# Patient Record
Sex: Female | Born: 1975 | Race: White | Hispanic: No | Marital: Married | Smoking: Former smoker
Health system: Southern US, Community
[De-identification: ages and names within clinical notes are randomized; demographics above are authoritative.]

## PROBLEM LIST (undated history)

## (undated) DIAGNOSIS — F32A Depression, unspecified: Secondary | ICD-10-CM

## (undated) DIAGNOSIS — N920 Excessive and frequent menstruation with regular cycle: Secondary | ICD-10-CM

## (undated) DIAGNOSIS — F419 Anxiety disorder, unspecified: Secondary | ICD-10-CM

## (undated) DIAGNOSIS — T7840XA Allergy, unspecified, initial encounter: Secondary | ICD-10-CM

## (undated) DIAGNOSIS — N946 Dysmenorrhea, unspecified: Secondary | ICD-10-CM

## (undated) DIAGNOSIS — C4359 Malignant melanoma of other part of trunk: Secondary | ICD-10-CM

## (undated) DIAGNOSIS — F909 Attention-deficit hyperactivity disorder, unspecified type: Secondary | ICD-10-CM

## (undated) DIAGNOSIS — F329 Major depressive disorder, single episode, unspecified: Secondary | ICD-10-CM

## (undated) HISTORY — PX: SKIN CANCER EXCISION: SHX779

## (undated) HISTORY — DX: Dysmenorrhea, unspecified: N94.6

## (undated) HISTORY — DX: Anxiety disorder, unspecified: F41.9

## (undated) HISTORY — DX: Malignant melanoma of other part of trunk: C43.59

## (undated) HISTORY — DX: Major depressive disorder, single episode, unspecified: F32.9

## (undated) HISTORY — PX: WRIST SURGERY: SHX841

## (undated) HISTORY — PX: BREAST ENHANCEMENT SURGERY: SHX7

## (undated) HISTORY — DX: Attention-deficit hyperactivity disorder, unspecified type: F90.9

## (undated) HISTORY — DX: Depression, unspecified: F32.A

## (undated) HISTORY — DX: Excessive and frequent menstruation with regular cycle: N92.0

## (undated) HISTORY — DX: Allergy, unspecified, initial encounter: T78.40XA

---

## 2007-02-26 DIAGNOSIS — G4700 Insomnia, unspecified: Secondary | ICD-10-CM | POA: Insufficient documentation

## 2011-01-20 ENCOUNTER — Encounter: Payer: Self-pay | Admitting: Family Medicine

## 2011-01-20 ENCOUNTER — Ambulatory Visit (INDEPENDENT_AMBULATORY_CARE_PROVIDER_SITE_OTHER): Payer: BC Managed Care – PPO | Admitting: Family Medicine

## 2011-01-20 VITALS — BP 120/80 | HR 81 | Temp 98.2°F | Ht 64.0 in | Wt 156.5 lb

## 2011-01-20 DIAGNOSIS — N920 Excessive and frequent menstruation with regular cycle: Secondary | ICD-10-CM

## 2011-01-20 DIAGNOSIS — G43009 Migraine without aura, not intractable, without status migrainosus: Secondary | ICD-10-CM | POA: Insufficient documentation

## 2011-01-20 DIAGNOSIS — G43909 Migraine, unspecified, not intractable, without status migrainosus: Secondary | ICD-10-CM

## 2011-01-20 DIAGNOSIS — J309 Allergic rhinitis, unspecified: Secondary | ICD-10-CM | POA: Insufficient documentation

## 2011-01-20 MED ORDER — SUMATRIPTAN SUCCINATE 50 MG PO TABS: 50.0000 mg | ORAL_TABLET | Freq: Once | ORAL | Status: DC | PRN

## 2011-01-20 MED ORDER — FLUTICASONE PROPIONATE 50 MCG/ACT NA SUSP: 2.0000 | Freq: Every day | NASAL | Status: DC

## 2011-01-20 MED ORDER — NORETHIN-ETH ESTRAD-FE BIPHAS 1 MG-10 MCG / 10 MCG PO TABS: 1.0000 | ORAL_TABLET | Freq: Every day | ORAL | Status: DC

## 2011-01-20 NOTE — Patient Instructions (Signed)
Nice to meet you. I have sent lo loestrin to your pharmacy. You can start this whenever you like Please call me anytime if you feel it is not working or if you're having other symptoms.

## 2011-01-20 NOTE — Progress Notes (Signed)
Subjective:     Patient ID: Haley Moore, female   DOB: 06-01-1975, 35 y.o.   MRN: [id number]  HPI  35 yo GO here to establish care and discuss irregular menses.  Has always had issues with irregular, heavy bleeding, cramping with her periods as well as menstrual migraines. Has been followed by GYN and tried numerous methods including:  Orthotricyclin- made her nauseated Yaz- severe nausea Nuva- ring -worked well for two yaers then started spotting and cramping. Mirena- severe cramping, did not lighten periods- had it removed after 3 months. Haley Moore- feels it is not working, having heavy and irregular periods with nausea and bloating. Per pt, blood work recently checked, like thyroid function and was wnl.  Migraines- usually gets one per month a few days before her period.  Associated with photophobia, phonphobia and nausea.  Allergic rhinitis- on Zyrtec and flonase which typically controls her symptoms.  She is a non smoker.  Patient Active Problem List  Diagnoses  . Migraine  . Menorrhagia  . Allergy   Past Medical History  Diagnosis Date  . Migraine   . Menorrhagia   . Allergy    No past surgical history on file. History  Substance Use Topics  . Smoking status: Never Smoker   . Smokeless tobacco: Not on file  . Alcohol Use: Not on file   No family history on file. No Known Allergies No current outpatient prescriptions on file prior to visit.   The PMH, PSH, Social History, Family History, Medications, and allergies have been reviewed in [facility], and have been updated if relevant.   Review of Systems See HPI Patient reports no  vision/ hearing changes,anorexia, weight change, fever ,adenopathy, persistant / recurrent hoarseness, swallowing issues, chest pain, edema,persistant / recurrent cough, hemoptysis, dyspnea(rest, exertional, paroxysmal nocturnal), gastrointestinal  bleeding (melena, rectal bleeding), abdominal pain, excessive heart burn, GU symptoms(dysuria,  hematuria, pyuria, voiding/incontinence  Issues) syncope, focal weakness, severe memory loss, concerning skin lesions, depression, anxiety, abnormal bruising/bleeding, major joint swelling, breast masses.     Objective:   Physical Exam BP 120/80  Pulse 81  Temp(Src) 98.2 F (36.8 C) (Oral)  Ht 5\' 4"  (1.626 m)  Wt 156 lb 8 oz (70.988 kg)  BMI 26.86 kg/m2  General:  Well-developed,well-nourished,in no acute distress; alert,appropriate and cooperative throughout examination Head:  normocephalic and atraumatic.   Eyes:  vision grossly intact, pupils equal, pupils round, and pupils reactive to light.   Ears:  R ear normal and L ear normal.   Lungs:  Normal respiratory effort, chest expands symmetrically. Lungs are clear to auscultation, no crackles or wheezes. Heart:  Normal rate and regular rhythm. S1 and S2 normal without gallop, murmur, click, rub or other  Msk:  No deformity or scoliosis noted of thoracic or lumbar spine.   Extremities:  No clubbing, cyanosis, edema, or deformity noted with normal full range of motion of all joints.   Neurologic:  alert & oriented X3 and gait normal.   Skin:  Intact without suspicious lesions or rashes Psych:  Cognition and judgment appear intact. Alert and cooperative with normal attention span and concentration. No apparent delusions, illusions, hallucinations         Assessment and Plan: 1. Allergic rhinitis  Stable.  Continue Zyrtec and refill flonase.  2. Menorrhagia  Deteriorated. >35 min spent with face to face with patient, >50% counseling and/or coordinating care. Will try lo loestrin, perhaps it is the estrogen that is causing issues although I counsel on the potential for  break through bleeding.    3. Migraine  Menstrual.  Given rx for imitrex for abortive therapy. Only occuring monthly, prophylactic therapy not necessary at this point.

## 2011-01-27 ENCOUNTER — Encounter: Payer: Self-pay | Admitting: Family Medicine

## 2011-02-11 ENCOUNTER — Telehealth: Payer: Self-pay | Admitting: Family Medicine

## 2011-02-11 NOTE — Telephone Encounter (Signed)
Called patient and she stated that Imitrex is not helping her headache.  She stated that she has only taken the medication once since it was prescribed to her.  She stated that on a scale of 1-10 her headache was an 8, after she took Imitrex it was a 10.  Patient also stated that she would not be back to see Dr. Dayton Martes.  She received a bill and was charged $195 for a new patient exam and she stated that Dr. Dayton Martes barely spent 20 minutes with her.  She stated that she is very upset and frustrated with [place] because every time she calls our office she can never get through and it takes her more than three attempts to get anyone on the line.  She stated that she told Dr. Dayton Martes what headache medication worked for her in the past but Dr. Dayton Martes prescribed something different.  Patient would like a different headache medication.

## 2011-02-11 NOTE — Telephone Encounter (Signed)
I am sorry that she is so upset but I cannot prescribe a new medication to her if she tells me that she is not returning for follow up.  That is not safe practice for me to prescribe a medication to a patient who does not want to return for follow up for a medication like this. I prescribed imitrex first as I explained to her in the office because it is typically the one covered by insurance first.  Given that her headaches are so severe, I would recommend seeing a headache specialist and I would be happy to place that referral for her.

## 2011-02-11 NOTE — Telephone Encounter (Signed)
Patient advised as instructed via telephone.  She stated that she will find another provider because she wasn't happy.

## 2011-02-11 NOTE — Telephone Encounter (Signed)
Patient has questions about medication and said that since changing medications headache has intensified.  Would like a call back for advise.

## 2014-04-25 ENCOUNTER — Emergency Department: Payer: Self-pay | Admitting: Emergency Medicine

## 2014-06-26 LAB — CBC AND DIFFERENTIAL
HCT: 40 % (ref 36–46)
HEMOGLOBIN: 13.4 g/dL (ref 12.0–16.0)
Platelets: 277 10*3/uL (ref 150–399)
WBC: 4.9 10^3/mL

## 2014-06-26 LAB — BASIC METABOLIC PANEL
BUN: 22 mg/dL — AB (ref 4–21)
Creatinine: 0.7 mg/dL (ref 0.5–1.1)
Glucose: 82 mg/dL
POTASSIUM: 4.2 mmol/L (ref 3.4–5.3)
SODIUM: 140 mmol/L (ref 137–147)

## 2014-06-26 LAB — TSH: TSH: 3.08 u[IU]/mL (ref 0.41–5.90)

## 2014-07-16 ENCOUNTER — Ambulatory Visit (INDEPENDENT_AMBULATORY_CARE_PROVIDER_SITE_OTHER): Payer: BLUE CROSS/BLUE SHIELD | Admitting: Obstetrics and Gynecology

## 2014-07-16 ENCOUNTER — Encounter: Payer: Self-pay | Admitting: Obstetrics and Gynecology

## 2014-07-16 VITALS — BP 93/62 | HR 66 | Ht 64.0 in | Wt 171.9 lb

## 2014-07-16 DIAGNOSIS — N939 Abnormal uterine and vaginal bleeding, unspecified: Secondary | ICD-10-CM

## 2014-07-16 DIAGNOSIS — N921 Excessive and frequent menstruation with irregular cycle: Secondary | ICD-10-CM

## 2014-07-16 DIAGNOSIS — N946 Dysmenorrhea, unspecified: Secondary | ICD-10-CM | POA: Insufficient documentation

## 2014-07-16 DIAGNOSIS — N920 Excessive and frequent menstruation with regular cycle: Secondary | ICD-10-CM | POA: Insufficient documentation

## 2014-07-16 DIAGNOSIS — N923 Ovulation bleeding: Secondary | ICD-10-CM

## 2014-07-16 MED ORDER — LEVONORGEST-ETH ESTRAD 91-DAY 0.15-0.03 MG PO TABS: 1.0000 | ORAL_TABLET | Freq: Every day | ORAL | Status: DC

## 2014-07-17 ENCOUNTER — Encounter: Payer: Self-pay | Admitting: Family Medicine

## 2014-07-17 NOTE — Progress Notes (Signed)
Subjective:    Haley Moore is a 39 y.o. P0 female who presents for evaluation of menstrual symptoms. Symptoms began several years ago ago. Patient describes symptoms of decreased libido (has not been intimate with husband in 7 months), labile mood (severe), menorrhagia (mild, but now with intermenstrual spotting), menstrual cramping (moderate), migraine headaches (moderate). Symptoms occur erratically during the cycle. Patient denies anxiety, breast tenderness and dyspareunia. Evaluation to date includes: GYN evaluation (performed by previous GYN ~ 1 year ago). Treatment to date includes: Oral contraceptive pills per medication list:(several different types, somewhat effective), NuvaRing, Mirena IUD (removed after 3 months due to continued pelvic pain, Immitrex for menstrual migraine headaches (made things worse), Sertraline for PMS symptoms, which has helped significantly, however worsened libido changes.  Patient notes that she was told by previous GYN that she was out of options and that there was "nothing more that could be done".   Menstrual History: OB History    Gravida Para Term Preterm AB TAB SAB Ectopic Multiple Living   0 0 0 0 0 0 0 0 0 0       Menarche age: 58  LMP unsure.    Past Medical History  Diagnosis Date  . Anxiety   . Menorrhagia   . Dysmenorrhea    Past Surgical History  Procedure Laterality Date  . Wrist surgery    . Breast enhancement surgery     Family History  Problem Relation Age of Onset  . Heart disease Mother    History   Social History  . Marital Status: Married    Spouse Name: N/A  . Number of Children: N/A  . Years of Education: N/A   Occupational History  . Not on file.   Social History Main Topics  . Smoking status: Former Smoker    Quit date: 02/07/1998  . Smokeless tobacco: Never Used  . Alcohol Use: Yes     Comment: Socially  . Drug Use: No  . Sexual Activity: Yes    Birth Control/ Protection: Pill   Other Topics Concern  .  Not on file   Social History Narrative  . No narrative on file   No current outpatient prescriptions on file prior to visit.   No current facility-administered medications on file prior to visit.   Allergies  Allergen Reactions  . Shellfish Allergy     Review of Systems Pertinent items are noted in HPI.   Objective:    BP 93/62 mmHg  Pulse 66  Ht 5\' 4"  (1.626 m)  Wt 171 lb 14.4 oz (77.973 kg)  BMI 29.49 kg/m2  LMP  (LMP Unknown) General appearance: alert and cooperative Lungs: clear to auscultation bilaterally Heart: regular rate and rhythm, S1, S2 normal, no murmur, click, rub or gallop Abdomen: soft, non-tender; bowel sounds normal; no masses,  no organomegaly Pelvic: cervix normal in appearance, external genitalia normal, no adnexal masses or tenderness, no bladder tenderness, no cervical motion tenderness, rectovaginal septum normal, uterus normal size, shape, and consistency and vagina normal without discharge Extremities: extremities normal, atraumatic, no cyanosis or edema   Assessment:    PMS: moderate Premenstrual dysphoria: moderate    Plan:  Discussion had on other available options, including OCP continuous method, (as patient has only been on cyclic regimens), Nexplanon, Lupron, or surgical methods (endometrial ablation/hysterectomy).  Patient declines surgical methods at this time. Notes that OCPs have worked well for menstrual migraines, however typically has had intermenstrual bleeding while on them.  Would like to try continuous  method. Prescribed Seasonale.  Notes Sertraline working well for mood changes, to continue.  Pelvic ultrasound. Reports last ultrasound was several years ago.  Will assess for any other causes of abnormal bleeding (i.e. Polyps, Fibroids).  Follow up in 2 weeks or as needed.   Desired pap smear during today's exam, NuSwab performed to r/o vaginitis/cervicitis as potential cause of intermenstrual bleeding.    Rubie Maid,  MD [facility]

## 2014-07-17 NOTE — Patient Instructions (Signed)
Pick up prescription after today's visit.  Return in 1-2 weeks for ultrasound.

## 2014-07-19 LAB — PAP IG AND HPV HIGH-RISK
HPV, high-risk: NEGATIVE
PAP Smear Comment: 0

## 2014-07-19 LAB — NUSWAB VAGINITIS PLUS (VG+)

## 2014-07-23 ENCOUNTER — Telehealth: Payer: Self-pay

## 2014-07-23 NOTE — Telephone Encounter (Signed)
Pt informed of negative PAP.

## 2014-07-23 NOTE — Telephone Encounter (Signed)
-----   Message from Rubie Maid, MD sent at 07/21/2014  2:38 PM EDT ----- Normal pap. Can inform.

## 2014-08-07 ENCOUNTER — Telehealth: Payer: Self-pay | Admitting: Obstetrics and Gynecology

## 2014-08-07 NOTE — Telephone Encounter (Signed)
Patient called upset that she received a bill for a nuswab test done at her visit. She received a bill for over $900 and states she was only coming in for a pap and birth control. A nuswab is not covered during an annual visit and feels she should have been notified of this. Patient also states she will probably not be coming back here as see was not happy with her visit with us,and she feels she should not be responsible for the [facility] bill.

## 2014-08-07 NOTE — Telephone Encounter (Signed)
Please inform patient that her visit included multiple complaints regarding her abnormal menstrual cycle and (other menstrual symptoms) including spotting in between periods.  The Nuswab was performed for workup of this as sometimes vaginal infections can be the cause of this. We can attempt to rebill the test using a different code, but was necessary to perform.  I am sorry that her insurance did not cover this and there was no way of me knowing this before the test was performed.  I am sorry that she was unhappy with her care here, and hope that she can find someone more suitable for her needs.

## 2014-10-07 ENCOUNTER — Ambulatory Visit (INDEPENDENT_AMBULATORY_CARE_PROVIDER_SITE_OTHER): Payer: BLUE CROSS/BLUE SHIELD | Admitting: Family Medicine

## 2014-10-07 ENCOUNTER — Encounter: Payer: Self-pay | Admitting: Family Medicine

## 2014-10-07 ENCOUNTER — Telehealth: Payer: Self-pay | Admitting: Family Medicine

## 2014-10-07 VITALS — BP 106/62 | HR 79 | Temp 98.0°F | Resp 16 | Ht 64.0 in | Wt 177.2 lb

## 2014-10-07 DIAGNOSIS — S161XXA Strain of muscle, fascia and tendon at neck level, initial encounter: Secondary | ICD-10-CM | POA: Diagnosis not present

## 2014-10-07 DIAGNOSIS — G43009 Migraine without aura, not intractable, without status migrainosus: Secondary | ICD-10-CM | POA: Diagnosis not present

## 2014-10-07 MED ORDER — CYCLOBENZAPRINE HCL 5 MG PO TABS: 5.0000 mg | ORAL_TABLET | Freq: Three times a day (TID) | ORAL | Status: DC | PRN

## 2014-10-07 MED ORDER — ISOMETHEPTENE-DICHLORAL-APAP 65-100-325 MG PO CAPS: ORAL_CAPSULE | ORAL | Status: DC

## 2014-10-07 NOTE — Telephone Encounter (Signed)
Pt was in earlier today.  She was thinking she was getting two prescriptions but when she got to the pharmacy there was only one there.  She thinks she did not get the one for migraines.  Could someone please call her back.   [phone]  Thanks, Con Memos

## 2014-10-07 NOTE — Telephone Encounter (Signed)
Patient advised I called in prescription to [facility]. KW

## 2014-10-07 NOTE — Patient Instructions (Addendum)
Try heat for 20 minutes to your neck several x day for neck pain/tension. Avoid upper body workouts for now. Level out your bed pillow so your head is in line with your shoulders and hips when lying on your side.

## 2014-10-07 NOTE — Progress Notes (Signed)
Subjective:     Patient ID: Haley Moore, female   DOB: [birth year], 39 y.o.   MRN: [id number]  HPI  Chief Complaint  Patient presents with  . Headache    Patient comes in office today with concerns of migraine/headache for the past 3 weeks. Patient states that she has had tightness in her neck and shoulders and when migraines do happen she experiences nausea. Patient describes migraine/headache as sharp and stabbing, she reports taking otc Ibuprofen and Aleve.   States she has had a recurrence of migraine headache which lasted for 2-3 days. Reports stereotypical stabbing behind her right eye, photophobia, and nausea. Has been on continuous cycle BCP's for the last 3 months. Also has been working out with a trainer using free and machine weights and has developed tightness and pain in her bilateral upper trapezius area. Has tried nsaid's, Icy Hot, and TENS unit with little improvement. Currently uses two pillows in her bed. No radiation of symptoms.   Review of Systems  Musculoskeletal:       Had ORIF of right wrist fracture this summer. 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       Objective:   Physical Exam  Constitutional: She appears well-developed and well-nourished. No distress.  Musculoskeletal:  Mild tenderness over bilateral upper trapezius area. Cervical and bilateral shoulders with FROM. EF/EE/grips 5/5.       Assessment:    1. Cervical strain, initial encounter - cyclobenzaprine (FLEXERIL) 5 MG tablet; Take 1 tablet (5 mg total) by mouth 3 (three) times daily as needed for muscle spasms.  Dispense: 21 tablet; Refill: 1  2. Migraine without aura and without status migrainosus, not intractable - isometheptene-acetaminophen-dichloralphenazone (MIDRIN) 65-100-325 MG capsule; For migraine headache take two at onset then one every hour as needed up to 5 pills in a 12 hour period.  Dispense: 30 capsule; Refill: 0     Plan:   Encouraged use of warm compresses and discussed bedtime ergonomics.

## 2014-10-07 NOTE — Telephone Encounter (Signed)
Pt states that she did not get the printed prescription for the Midrin for the migraines.   Can you check on this?  Thanks,

## 2014-12-08 ENCOUNTER — Encounter: Payer: Self-pay | Admitting: Family Medicine

## 2014-12-08 ENCOUNTER — Ambulatory Visit (INDEPENDENT_AMBULATORY_CARE_PROVIDER_SITE_OTHER): Payer: BLUE CROSS/BLUE SHIELD | Admitting: Family Medicine

## 2014-12-08 VITALS — BP 100/68 | HR 72 | Temp 98.0°F | Resp 16 | Wt 175.2 lb

## 2014-12-08 DIAGNOSIS — F329 Major depressive disorder, single episode, unspecified: Secondary | ICD-10-CM | POA: Insufficient documentation

## 2014-12-08 DIAGNOSIS — F32A Depression, unspecified: Secondary | ICD-10-CM | POA: Insufficient documentation

## 2014-12-08 DIAGNOSIS — N943 Premenstrual tension syndrome: Secondary | ICD-10-CM | POA: Insufficient documentation

## 2014-12-08 DIAGNOSIS — G43009 Migraine without aura, not intractable, without status migrainosus: Secondary | ICD-10-CM

## 2014-12-08 DIAGNOSIS — Z86010 Personal history of colonic polyps: Secondary | ICD-10-CM | POA: Insufficient documentation

## 2014-12-08 DIAGNOSIS — A630 Anogenital (venereal) warts: Secondary | ICD-10-CM | POA: Insufficient documentation

## 2014-12-08 DIAGNOSIS — Z8619 Personal history of other infectious and parasitic diseases: Secondary | ICD-10-CM | POA: Insufficient documentation

## 2014-12-08 DIAGNOSIS — T7802XA Anaphylactic reaction due to shellfish (crustaceans), initial encounter: Secondary | ICD-10-CM | POA: Insufficient documentation

## 2014-12-08 DIAGNOSIS — H209 Unspecified iridocyclitis: Secondary | ICD-10-CM | POA: Insufficient documentation

## 2014-12-08 MED ORDER — KETOROLAC TROMETHAMINE 30 MG/ML IJ SOLN
30.0000 mg | Freq: Once | INTRAMUSCULAR | Status: AC
  Administered 2014-12-08: 30 mg via INTRAMUSCULAR

## 2014-12-08 MED ORDER — ONDANSETRON 8 MG PO TBDP: 8.0000 mg | ORAL_TABLET | Freq: Three times a day (TID) | ORAL | Status: DC | PRN

## 2014-12-08 MED ORDER — KETOROLAC TROMETHAMINE 60 MG/2ML IM SOLN: 60.0000 mg | Freq: Once | INTRAMUSCULAR | Status: DC

## 2014-12-08 NOTE — Progress Notes (Signed)
Subjective:     Patient ID: Haley Moore, female   DOB: 05-May-1975, 39 y.o.   MRN: [id number]  HPI  Chief Complaint  Patient presents with  . Headache  States she developed a persistent migraine headache on 10/28. She was vomiting yesterday and remains nauseous today. Took Midrin at onset with little relief but states lying in a dark room helps."I haven't vomited like this with a headache in two years." Has been going to work despite this and wishes to work Midwife. Unsure of present trigger but has had menstrual migraine in the past. Currently on continuous birth control.   Review of Systems  Neurological:       Last "bad" headache in August.       Objective:   Physical Exam  Constitutional: She appears well-developed and well-nourished. She appears distressed (moderate discomfort from pain.).  Eyes: EOM are normal. Pupils are equal, round, and reactive to light.  Musculoskeletal:  Grip strength 5/5 symmetrically.       Assessment:    1. Migraine without aura and without status migrainosus, not intractable - ketorolac (TORADOL) injection 60 mg; Inject 2 mLs (60 mg total) into the muscle once. - ondansetron (ZOFRAN ODT) 8 MG disintegrating tablet; Take 1 tablet (8 mg total) by mouth every 8 (eight) hours as needed for nausea or vomiting.  Dispense: 12 tablet; Refill: 0    Plan:   F/u as needed if not improving.

## 2014-12-08 NOTE — Patient Instructions (Addendum)
Let me know if headache persists or you are having them frequently.

## 2015-03-05 ENCOUNTER — Other Ambulatory Visit: Payer: Self-pay | Admitting: Family Medicine

## 2015-03-05 DIAGNOSIS — J301 Allergic rhinitis due to pollen: Secondary | ICD-10-CM

## 2015-03-05 DIAGNOSIS — F329 Major depressive disorder, single episode, unspecified: Secondary | ICD-10-CM

## 2015-03-05 DIAGNOSIS — F32A Depression, unspecified: Secondary | ICD-10-CM

## 2015-03-05 MED ORDER — FLUTICASONE PROPIONATE 50 MCG/ACT NA SUSP: 2.0000 | Freq: Every day | NASAL | Status: DC

## 2015-03-05 MED ORDER — SERTRALINE HCL 50 MG PO TABS: 50.0000 mg | ORAL_TABLET | Freq: Every day | ORAL | Status: DC

## 2015-03-30 ENCOUNTER — Encounter: Payer: Self-pay | Admitting: Family Medicine

## 2015-03-30 ENCOUNTER — Other Ambulatory Visit: Payer: Self-pay | Admitting: Family Medicine

## 2015-03-30 ENCOUNTER — Ambulatory Visit (INDEPENDENT_AMBULATORY_CARE_PROVIDER_SITE_OTHER): Payer: BLUE CROSS/BLUE SHIELD | Admitting: Family Medicine

## 2015-03-30 ENCOUNTER — Ambulatory Visit
Admission: RE | Admit: 2015-03-30 | Discharge: 2015-03-30 | Disposition: A | Discharge status: Home / Self Care | Payer: BLUE CROSS/BLUE SHIELD | Source: Ambulatory Visit | Attending: Family Medicine | Admitting: Family Medicine

## 2015-03-30 VITALS — BP 102/82 | HR 65 | Temp 98.0°F | Resp 16 | Wt 176.2 lb

## 2015-03-30 DIAGNOSIS — G44229 Chronic tension-type headache, not intractable: Secondary | ICD-10-CM

## 2015-03-30 DIAGNOSIS — G43009 Migraine without aura, not intractable, without status migrainosus: Secondary | ICD-10-CM | POA: Diagnosis not present

## 2015-03-30 DIAGNOSIS — J301 Allergic rhinitis due to pollen: Secondary | ICD-10-CM | POA: Diagnosis not present

## 2015-03-30 DIAGNOSIS — M542 Cervicalgia: Secondary | ICD-10-CM

## 2015-03-30 MED ORDER — SUMATRIPTAN SUCCINATE 50 MG PO TABS: 50.0000 mg | ORAL_TABLET | Freq: Once | ORAL | Status: DC

## 2015-03-30 MED ORDER — NORTRIPTYLINE HCL 10 MG PO CAPS: ORAL_CAPSULE | ORAL | Status: DC

## 2015-03-30 MED ORDER — KETOROLAC TROMETHAMINE 60 MG/2ML IM SOLN
60.0000 mg | Freq: Once | INTRAMUSCULAR | Status: AC
  Administered 2015-03-30: 60 mg via INTRAMUSCULAR

## 2015-03-30 MED ORDER — LEVOCETIRIZINE DIHYDROCHLORIDE 5 MG PO TABS: 5.0000 mg | ORAL_TABLET | Freq: Every day | ORAL | Status: DC

## 2015-03-30 NOTE — Progress Notes (Addendum)
Subjective:     Patient ID: Haley Moore, female   DOB: 07-29-1975, 40 y.o.   MRN: [id number]  HPI  Chief Complaint  Patient presents with  . Headache    Patient comes in office today with complaints of migraine headache for the past 6 months, patient reports that it has intensified over the past couple months. Patient states that headaches were intermitten and last 3 days but now he is experiencing pain on a daily basis. Patient has tried otc Excedrin Migraine, Aleve and Ibuprofen  States she keeps tightness in her neck and shoulders and will develop pressure headaches (migraines occur on the right side). Current headache is 7-8/10. Has received chiropractic treatment and massages with little improvement. States work and home are doing well. Previously has wrestled but denies specific injury.   Review of Systems  HENT:       No recent eye exam.  Gastrointestinal: Negative for nausea and vomiting.       Objective:   Physical Exam  Constitutional: She appears well-developed and well-nourished. She appears distressed (moderate pain).  Eyes: EOM are normal. Pupils are equal, round, and reactive to light.  Musculoskeletal:  Grip strength 5/5. Cervical FROM  Neurological: Coordination (finger to nose WNL) normal.       Assessment:    1. Migraine without aura and without status migrainosus, not intractable  - ketorolac (TORADOL) injection 60 mg; Inject 2 mLs (60 mg total) into the muscle once. - SUMAtriptan (IMITREX) 50 MG tablet; Take 1 tablet (50 mg total) by mouth once. May repeat in 2 hours if headache not improved.  Dispense: 8 tablet; Refill: 2  2. Neck pain, bilateral--association with near daily headaches. - DG Cervical Spine Complete; Future  3. Allergic rhinitis due to pollen - levocetirizine (XYZAL) 5 MG tablet; Take 1 tablet (5 mg total) by mouth daily.  Dispense: 30 tablet; Refill: 5    Plan:   Further f/u pending x-ray report. Patient is considering acupuncture  as well.

## 2015-03-30 NOTE — Patient Instructions (Signed)
We will call you with the x-ray results. Daily use of pain medication can keep headaches going: try to avoid if possible. Update your eye exam. We will discuss other ideas once x-ray is done.

## 2015-03-30 NOTE — Addendum Note (Signed)
Addended by: Quay Burow on: 03/30/2015 10:52 AM   Modules accepted: Orders, Medications

## 2015-04-11 ENCOUNTER — Encounter: Payer: Self-pay | Admitting: Family Medicine

## 2015-04-11 ENCOUNTER — Ambulatory Visit (INDEPENDENT_AMBULATORY_CARE_PROVIDER_SITE_OTHER): Payer: BLUE CROSS/BLUE SHIELD | Admitting: Family Medicine

## 2015-04-11 VITALS — BP 100/62 | HR 75 | Temp 98.1°F | Resp 18 | Wt 177.0 lb

## 2015-04-11 DIAGNOSIS — J329 Chronic sinusitis, unspecified: Secondary | ICD-10-CM | POA: Diagnosis not present

## 2015-04-11 MED ORDER — AMOXICILLIN 500 MG PO CAPS: 1000.0000 mg | ORAL_CAPSULE | Freq: Two times a day (BID) | ORAL | Status: AC

## 2015-04-11 NOTE — Progress Notes (Signed)
Patient: Haley Moore Female    DOB: November 27, 1975   40 y.o.   MRN: [id number] Visit Date: 04/11/2015  Today's Provider: Lelon Huh, MD   Chief Complaint  Patient presents with  . URI   Subjective:    URI  This is a new problem. Episode onset: 3 days ago. The problem has been gradually worsening. Maximum temperature: 99.8 3 days ago. Associated symptoms include congestion, coughing (thick green colored sputum), headaches, a plugged ear sensation, rhinorrhea, sinus pain, sneezing, a sore throat (improved today) and swollen glands. Pertinent negatives include no abdominal pain, chest pain, diarrhea, dysuria, ear pain, joint pain, joint swelling, nausea, neck pain, rash, vomiting or wheezing. Treatments tried: Muciex and NyQuil. The treatment provided mild relief.       Allergies  Allergen Reactions  . Shellfish Allergy    Previous Medications   EPIPEN 2-PAK 0.3 MG/0.3ML SOAJ INJECTION       FLUTICASONE (FLONASE) 50 MCG/ACT NASAL SPRAY    Place 2 sprays into both nostrils daily.   IBUPROFEN (ADVIL,MOTRIN) 600 MG TABLET       LEVOCETIRIZINE (XYZAL) 5 MG TABLET    Take 1 tablet (5 mg total) by mouth daily.   LEVONORGESTREL-ETHINYL ESTRADIOL (SEASONALE) 0.15-0.03 MG TABLET    Take 1 tablet by mouth daily.   MONTELUKAST (SINGULAIR) 10 MG TABLET    Reported on 03/30/2015   NORTRIPTYLINE (PAMELOR) 10 MG CAPSULE    One to three at bedtime   SERTRALINE (ZOLOFT) 50 MG TABLET    Take 1 tablet (50 mg total) by mouth daily.   SUMATRIPTAN (IMITREX) 50 MG TABLET    Take 1 tablet (50 mg total) by mouth once. May repeat in 2 hours if headache not improved.    Review of Systems  Constitutional: Positive for fever. Negative for chills, appetite change and fatigue.  HENT: Positive for congestion, postnasal drip, rhinorrhea, sinus pressure, sneezing and sore throat (improved today). Negative for ear pain, mouth sores and nosebleeds.   Eyes: Positive for discharge.  Respiratory: Positive  for cough (thick green colored sputum). Negative for chest tightness, shortness of breath and wheezing.   Cardiovascular: Negative for chest pain and palpitations.  Gastrointestinal: Negative for nausea, vomiting, abdominal pain and diarrhea.  Genitourinary: Negative for dysuria.  Musculoskeletal: Negative for joint pain and neck pain.  Skin: Negative for rash.  Neurological: Positive for headaches. Negative for dizziness and weakness.    Social History  Substance Use Topics  . Smoking status: Former Smoker    Quit date: 02/07/1998  . Smokeless tobacco: Never Used  . Alcohol Use: No   Objective:   BP 100/62 mmHg  Pulse 75  Temp(Src) 98.1 F (36.7 C) (Oral)  Resp 18  Wt 177 lb (80.287 kg)  SpO2 97%  LMP  (Within Months)  Physical Exam  General Appearance:    Alert, cooperative, no distress  HENT:   bilateral TM normal without fluid or infection, neck without nodes, pharynx erythematous without exudate, frontal sinus tender and nasal mucosa pale and congested  Eyes:    PERRL, conjunctiva/corneas clear, EOM's intact       Lungs:     Clear to auscultation bilaterally, respirations unlabored  Heart:    Regular rate and rhythm  Neurologic:   Awake, alert, oriented x 3. No apparent focal neurological           defect.           Assessment & Plan:  1. Sinusitis, unspecified chronicity, unspecified location Recommend OTC nasal saline every 2-3 hours. She will keep Flonase and Singulair on hold a couple of days since weather has gotten colder.  - amoxicillin (AMOXIL) 500 MG capsule; Take 2 capsules (1,000 mg total) by mouth 2 (two) times daily.  Dispense: 40 capsule; Refill: 0  Call if symptoms change or if not rapidly improving.          Lelon Huh, MD  [facility]

## 2015-04-17 ENCOUNTER — Telehealth: Payer: Self-pay | Admitting: Family Medicine

## 2015-04-17 MED ORDER — AZITHROMYCIN 250 MG PO TABS: ORAL_TABLET | ORAL | Status: AC

## 2015-04-17 NOTE — Telephone Encounter (Signed)
Pt stated she saw Dr. Caryn Section on 04/11/15 and started taking amoxicillin (AMOXIL) 500 MG capsule. Pt stated that the symptoms had started improving but she stated that she isn't feeling better. Pt wanted to know if she should try something else. Pharmacy: [facility]. Thanks TNP

## 2015-04-17 NOTE — Telephone Encounter (Signed)
Please advise 

## 2015-04-17 NOTE — Telephone Encounter (Signed)
Patient was notified.

## 2015-04-17 NOTE — Telephone Encounter (Signed)
Change to Zpack, rx has been sent to [facility]

## 2015-08-20 ENCOUNTER — Other Ambulatory Visit: Payer: Self-pay | Admitting: Family Medicine

## 2015-08-20 DIAGNOSIS — F329 Major depressive disorder, single episode, unspecified: Secondary | ICD-10-CM

## 2015-08-20 DIAGNOSIS — F32A Depression, unspecified: Secondary | ICD-10-CM

## 2015-08-20 MED ORDER — SERTRALINE HCL 50 MG PO TABS: 50.0000 mg | ORAL_TABLET | Freq: Every day | ORAL | Status: DC

## 2015-10-02 ENCOUNTER — Other Ambulatory Visit: Payer: Self-pay | Admitting: Family Medicine

## 2015-10-02 DIAGNOSIS — G43009 Migraine without aura, not intractable, without status migrainosus: Secondary | ICD-10-CM

## 2015-10-02 MED ORDER — SUMATRIPTAN SUCCINATE 50 MG PO TABS: 50.0000 mg | ORAL_TABLET | Freq: Once | ORAL | 2 refills | Status: DC

## 2015-10-20 ENCOUNTER — Other Ambulatory Visit: Payer: Self-pay | Admitting: Family Medicine

## 2015-10-20 ENCOUNTER — Telehealth: Payer: Self-pay | Admitting: Family Medicine

## 2015-10-20 DIAGNOSIS — F419 Anxiety disorder, unspecified: Secondary | ICD-10-CM

## 2015-10-20 MED ORDER — SERTRALINE HCL 100 MG PO TABS: 100.0000 mg | ORAL_TABLET | Freq: Every day | ORAL | 3 refills | Status: DC

## 2015-10-20 NOTE — Telephone Encounter (Signed)
Patient was advised. KW 

## 2015-10-20 NOTE — Telephone Encounter (Signed)
Pt calling saying thinks the sertraline 50 mg needs to be increased.  PMS is worse because of IUD and her moods are really bad and she has a lot more anxiety.  Please advise  [place].  Call back [phone]  Thanks Con Memos

## 2015-10-20 NOTE — Telephone Encounter (Signed)
Please advise if you would like patient to come in for office visit to discuss. KW

## 2015-10-20 NOTE — Telephone Encounter (Signed)
I have sent in higher dose. It will take two to four weeks for full effect so give it time.

## 2016-01-29 ENCOUNTER — Telehealth: Payer: Self-pay | Admitting: Family Medicine

## 2016-01-29 ENCOUNTER — Other Ambulatory Visit: Payer: Self-pay | Admitting: Family Medicine

## 2016-01-29 DIAGNOSIS — G43009 Migraine without aura, not intractable, without status migrainosus: Secondary | ICD-10-CM

## 2016-01-29 MED ORDER — SUMATRIPTAN SUCCINATE 50 MG PO TABS: 50.0000 mg | ORAL_TABLET | Freq: Once | ORAL | 2 refills | Status: DC

## 2016-01-29 NOTE — Telephone Encounter (Signed)
Pt contacted office for refill request on the following medications: SUMAtriptan (IMITREX) 50 MG tablet  [place]. Please advise. Thanks TNP

## 2016-01-29 NOTE — Telephone Encounter (Signed)
Imitrex refilled.

## 2016-01-29 NOTE — Telephone Encounter (Signed)
pt informed med called in via VM

## 2016-02-04 ENCOUNTER — Encounter: Payer: Self-pay | Admitting: Family Medicine

## 2016-02-04 ENCOUNTER — Ambulatory Visit (INDEPENDENT_AMBULATORY_CARE_PROVIDER_SITE_OTHER): Payer: BLUE CROSS/BLUE SHIELD | Admitting: Family Medicine

## 2016-02-04 VITALS — BP 98/62 | HR 68 | Temp 98.0°F | Resp 16 | Wt 170.4 lb

## 2016-02-04 DIAGNOSIS — F439 Reaction to severe stress, unspecified: Secondary | ICD-10-CM

## 2016-02-04 MED ORDER — CLONAZEPAM 0.5 MG PO TABS: 0.5000 mg | ORAL_TABLET | Freq: Two times a day (BID) | ORAL | 0 refills | Status: DC | PRN

## 2016-02-04 NOTE — Patient Instructions (Signed)
Stop HCG. Okay to grieve for your pet. Let me hear from you within two weeks.

## 2016-02-04 NOTE — Progress Notes (Signed)
Subjective:     Patient ID: Haley Moore, female   DOB: 22-Jul-1975, 40 y.o.   MRN: [id number]  HPI  Chief Complaint  Patient presents with  . Anxiety    Patient comes in office today with concerns of anxiety for the past month. Patient reports that she has been having frequent panic attacks, most recent was yesterday. Patient reports when she has attacks she is aware of her heart fluttering. Patient reports changes in behavior, isolation, and little to no interest in her daily activities. Patient is currently actively taking Sertraline.   States she has been on HCG and phentermine (1/2 pill) for weight loss per a diet center and last injected it yesterday. 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 Tearful about her dog who is dying of cancer. Reports she missed one week of sertraline when she had a virus but otherwise compliant Denies specific depression or suicidal ideation.   Review of Systems     Objective:   Physical Exam  Constitutional: She appears well-developed and well-nourished. She appears distressed (mild emotional distress).  Cardiovascular: Normal rate and regular rhythm.   Pulmonary/Chest: Breath sounds normal.       Assessment:    1. Situational stress - clonazePAM (KLONOPIN) 0.5 MG tablet; Take 1 tablet (0.5 mg total) by mouth 2 (two) times daily as needed for anxiety.  Dispense: 30 tablet; Refill: 0    Plan:    Continue sertraline at present dose and stop HCG and consider stopping phentermine due to concern about side effects. Return in two week or phone f/u if feeling better.

## 2016-02-10 DIAGNOSIS — D485 Neoplasm of uncertain behavior of skin: Secondary | ICD-10-CM | POA: Diagnosis not present

## 2016-02-10 DIAGNOSIS — L820 Inflamed seborrheic keratosis: Secondary | ICD-10-CM | POA: Diagnosis not present

## 2016-02-10 DIAGNOSIS — L821 Other seborrheic keratosis: Secondary | ICD-10-CM | POA: Diagnosis not present

## 2016-02-10 DIAGNOSIS — D0359 Melanoma in situ of other part of trunk: Secondary | ICD-10-CM | POA: Diagnosis not present

## 2016-02-10 DIAGNOSIS — D225 Melanocytic nevi of trunk: Secondary | ICD-10-CM | POA: Diagnosis not present

## 2016-02-10 DIAGNOSIS — D229 Melanocytic nevi, unspecified: Secondary | ICD-10-CM | POA: Diagnosis not present

## 2016-02-10 DIAGNOSIS — Z1283 Encounter for screening for malignant neoplasm of skin: Secondary | ICD-10-CM | POA: Diagnosis not present

## 2016-02-17 ENCOUNTER — Telehealth: Payer: Self-pay | Admitting: Family Medicine

## 2016-02-17 NOTE — Telephone Encounter (Signed)
Left message to increase clonazepam 0.5 mg.to two pills twice daily. Phone f/u in the next few days.

## 2016-02-17 NOTE — Telephone Encounter (Signed)
Pt was in a couple weeks ago and seen Mikki Santee and he prescribed Klonopin for her anxiety.   She is taking prn when she is having anxiety.  She says she does not think it is helping her that much.  It seemed to help the first time or two but not now.    Pt's call back is [phone]  Thanks teri

## 2016-03-01 DIAGNOSIS — D0359 Melanoma in situ of other part of trunk: Secondary | ICD-10-CM | POA: Diagnosis not present

## 2016-03-08 DIAGNOSIS — L819 Disorder of pigmentation, unspecified: Secondary | ICD-10-CM | POA: Diagnosis not present

## 2016-03-08 DIAGNOSIS — D2272 Melanocytic nevi of left lower limb, including hip: Secondary | ICD-10-CM | POA: Diagnosis not present

## 2016-03-08 DIAGNOSIS — D485 Neoplasm of uncertain behavior of skin: Secondary | ICD-10-CM | POA: Diagnosis not present

## 2016-03-08 DIAGNOSIS — D225 Melanocytic nevi of trunk: Secondary | ICD-10-CM | POA: Diagnosis not present

## 2016-03-08 DIAGNOSIS — L812 Freckles: Secondary | ICD-10-CM | POA: Diagnosis not present

## 2016-04-11 ENCOUNTER — Encounter: Payer: Self-pay | Admitting: Obstetrics and Gynecology

## 2016-04-11 ENCOUNTER — Inpatient Hospital Stay: Payer: Self-pay

## 2016-04-11 ENCOUNTER — Ambulatory Visit (INDEPENDENT_AMBULATORY_CARE_PROVIDER_SITE_OTHER): Payer: 59 | Admitting: Obstetrics and Gynecology

## 2016-04-11 VITALS — BP 120/80 | HR 78 | Ht 64.0 in | Wt 172.0 lb

## 2016-04-11 DIAGNOSIS — R102 Pelvic and perineal pain: Secondary | ICD-10-CM | POA: Diagnosis not present

## 2016-04-11 DIAGNOSIS — F3281 Premenstrual dysphoric disorder: Secondary | ICD-10-CM

## 2016-04-11 DIAGNOSIS — Z30431 Encounter for routine checking of intrauterine contraceptive device: Secondary | ICD-10-CM

## 2016-04-11 NOTE — Progress Notes (Signed)
History of Present Illness:  Haley Moore is a 41 y.o. that had a kyleena IUD placed approximately 6 months ago. Since that time, she had been doing well. She noted severe cramping for 10 days when LMP was due (2 wks ago). She was bent over in pain and took aleve/tylenol without relief. She had some improvement with heating pad. She also complains of bloating, decreased libido and severe PMS sx. She also has occas migraine headaches. She tried OCPs in the past for her many menstural sx, but pt couldn't tolerate the pills. She has migraines with aura. She is just miserable. She is on zoloft 100 mg daily for depression sx withotu relief of PMS sx. She exercises regularly.   Review of Systems  Constitutional: Positive for malaise/fatigue. Negative for fever and weight loss.  Gastrointestinal: Negative for blood in stool, constipation, diarrhea, nausea and vomiting.  Genitourinary: Negative for dysuria, flank pain, frequency, hematuria and urgency.  Musculoskeletal: Negative for back pain.  Skin: Negative for itching and rash.  Psychiatric/Behavioral: Positive for depression. The patient is nervous/anxious.     Physical Exam:  BP 120/80 (BP Location: Right Arm, Patient Position: Sitting)   Pulse 78   Ht 5\' 4"  (1.626 m)   Wt 172 lb (78 kg)   LMP 04/11/2016   BMI 29.52 kg/m  Body mass index is 29.52 kg/m.  Pelvic exam:  Two IUD strings present seen coming from the cervical os. EGBUS, vaginal vault and cervix: within normal limits   Assessment:  Routine checking of IUD Pelvic pain - For 10 days, improved now. Check IUD placement with u/s. If normal, then sx related to menstrual cycle. Can try Rx pain meds prn.  - Plan: US Pelvis Complete  Encounter for routine checking of intrauterine contraceptive device (IUD) - Plan: US Pelvis Complete  PMDD (premenstrual dysphoric disorder) - Pt with multiple sx. On zoloft. May try progesterone luteal phase to help with estrogen dominant sx, if u/s  WNL.    Plan: Will call pt with results and f/u.  Debborah Alonge B. Genavieve Mangiapane, PA-C 04/11/2016 11:51 AM

## 2016-04-14 ENCOUNTER — Telehealth: Payer: Self-pay

## 2016-04-14 NOTE — Telephone Encounter (Signed)
Pt calling triage and wanting her IUD removed. fwding to front desk to get this scheduled. ABC pt

## 2016-04-18 NOTE — Telephone Encounter (Signed)
Pt called stating she is having serious issues c IUD and wants it taken out immediately.

## 2016-04-18 NOTE — Telephone Encounter (Signed)
Pt is schedule 04/27/16 for Kyleena removal with [facility]

## 2016-04-18 NOTE — Telephone Encounter (Signed)
Pt is schedule 5/69/79 with [facility]

## 2016-04-19 ENCOUNTER — Ambulatory Visit (INDEPENDENT_AMBULATORY_CARE_PROVIDER_SITE_OTHER): Payer: 59 | Admitting: Obstetrics and Gynecology

## 2016-04-19 ENCOUNTER — Encounter (INDEPENDENT_AMBULATORY_CARE_PROVIDER_SITE_OTHER): Payer: Self-pay

## 2016-04-19 ENCOUNTER — Encounter: Payer: Self-pay | Admitting: Obstetrics and Gynecology

## 2016-04-19 VITALS — BP 110/80 | HR 72 | Ht 64.0 in | Wt 172.0 lb

## 2016-04-19 DIAGNOSIS — Z30432 Encounter for removal of intrauterine contraceptive device: Secondary | ICD-10-CM

## 2016-04-19 DIAGNOSIS — F3281 Premenstrual dysphoric disorder: Secondary | ICD-10-CM

## 2016-04-19 DIAGNOSIS — R102 Pelvic and perineal pain: Secondary | ICD-10-CM

## 2016-04-19 NOTE — Progress Notes (Signed)
     History of Present Illness:  Haley Moore is a 41 y.o. that had a kyleena IUD placed approximately 6 month ago. Since that time, she states  She has had bad cramping, pelvic pain, severe bloating, LBP, hair loss, mood changes. She did well the first 6 monhts but sx have worsened the past few wks. She was sched to have u/s last wk but  decided she just wants IUD removed. She has had similar side effects with multiple OCPs and has hx of migraines with aura now. She is not sex active because she feels so bad now. She is on zoloft for mood sx without relief. She wonders if sx are caused by hormones. She may want paragard IUD. She is "just done" and very frustrated because she feels bad all the time.   SEE 04/11/16 NOTE.   BP 110/80 (Patient Position: Sitting)   Pulse 72   Ht 5\' 4"  (1.626 m)   Wt 172 lb (78 kg)   LMP 04/11/2016   BMI 29.52 kg/m   Pelvic exam:  Two IUD strings present seen coming from the cervical os. EGBUS, vaginal vault and cervix: within normal limits  IUD Removal Strings of IUD identified and grasped.  IUD removed without problem with ring forceps.  Pt tolerated this well.  IUD noted to be intact.  Assessment:  IUD Removal Encounter for IUD removal  PMDD (premenstrual dysphoric disorder) - Discussed trying prometrium luteal phase to see if gets relief of bloating, mood changes, etc. Pt to f/u via phone/My Chart with next menses.   Pelvic pain - See if sx improve with IUD removal. If persist, check u/s.     Plan: IUD removed and plan for contraception is abstinence, condoms. She was amenable to this plan. Return if symptoms worsen or fail to improve.  Kenzel Ruesch B. Barrett Holthaus, PA-C 04/19/2016 4:38 PM

## 2016-04-25 ENCOUNTER — Ambulatory Visit: Payer: Self-pay | Admitting: Obstetrics and Gynecology

## 2016-04-27 ENCOUNTER — Ambulatory Visit: Payer: Self-pay | Admitting: Certified Nurse Midwife

## 2016-05-04 IMAGING — CR DG CERVICAL SPINE COMPLETE 4+V
1 series · 6 of 6 positions shown · non-contrast
Comparison: None.

CLINICAL DATA: Headaches.  Pain.

EXAM:
CERVICAL SPINE - COMPLETE 4+ VIEW

[Series 1: dg cervical spine complete · 0.14mm/px · 6 of 6 slices shown]
[im 1/6]
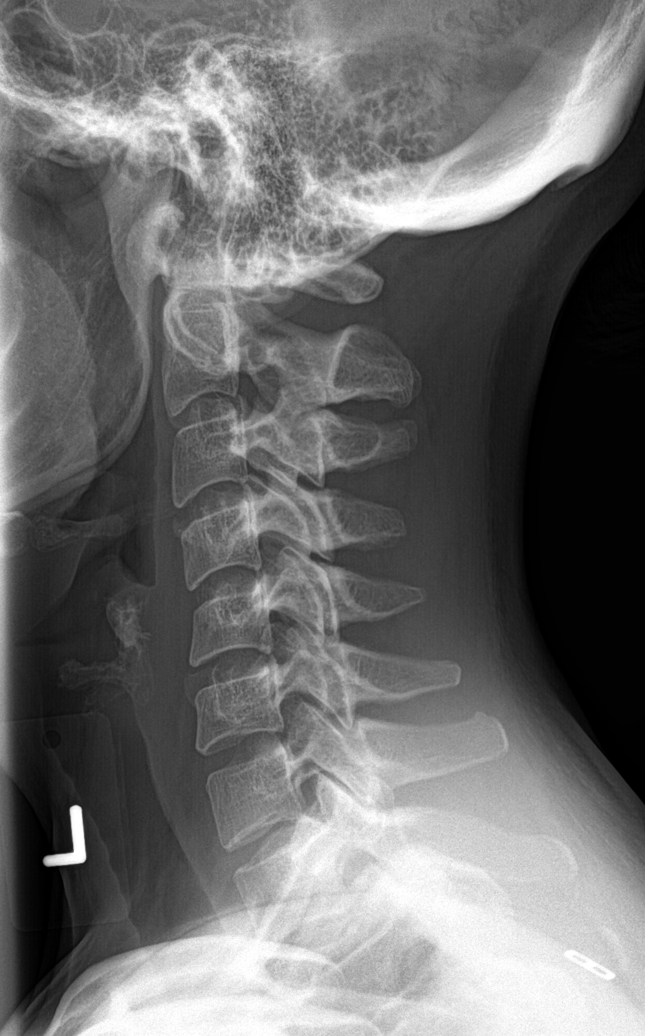
[im 2/6]
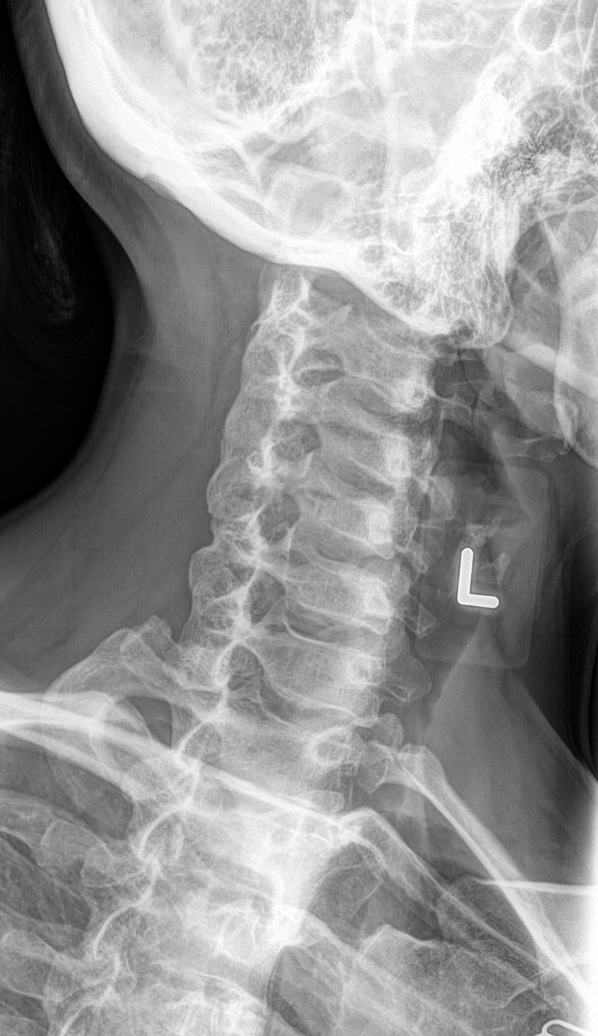
[im 3/6]
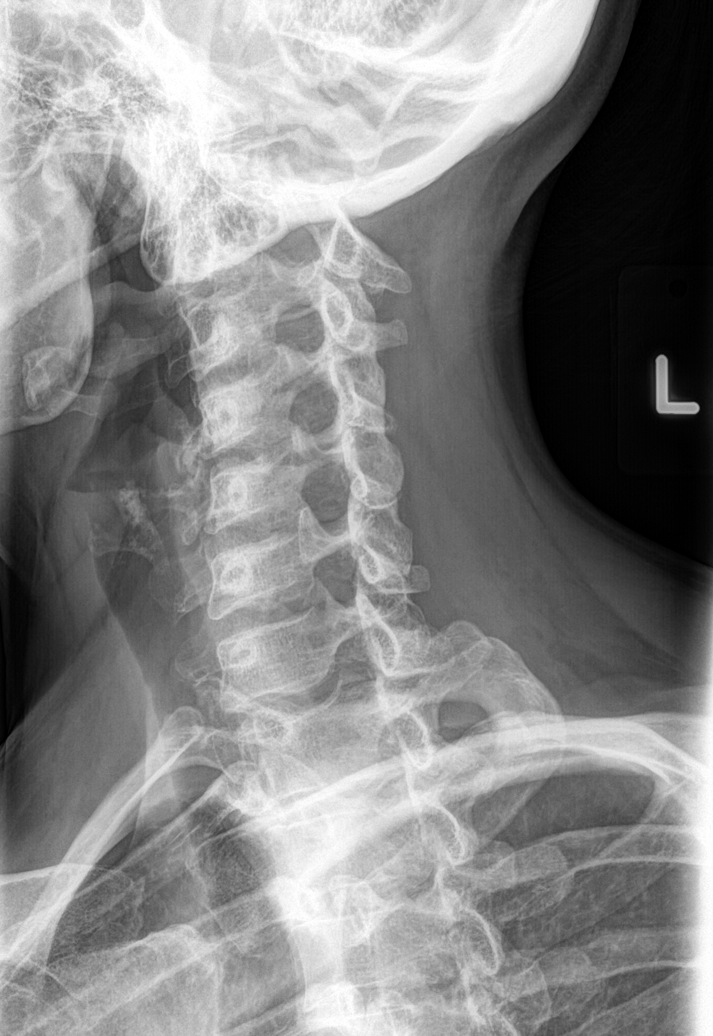
[im 4/6]
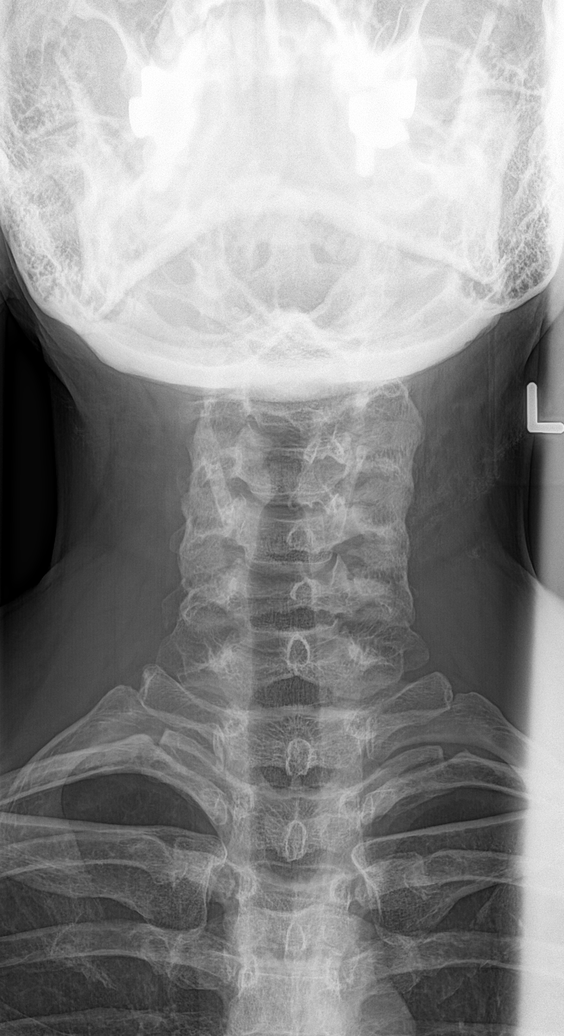
[im 5/6]
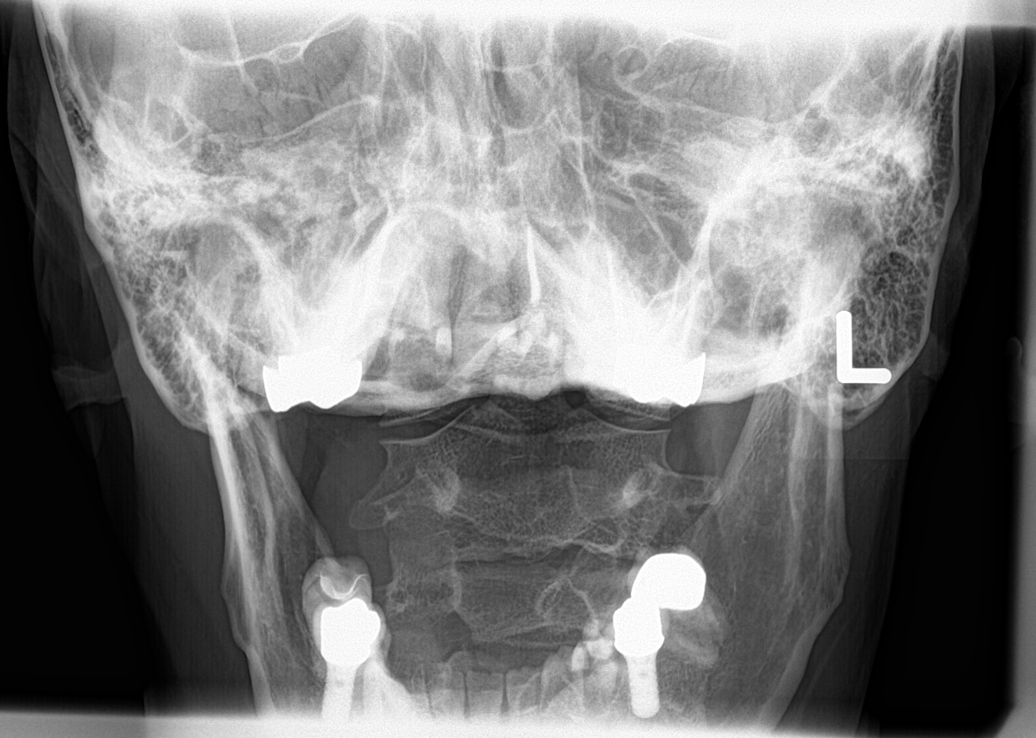
[im 6/6]
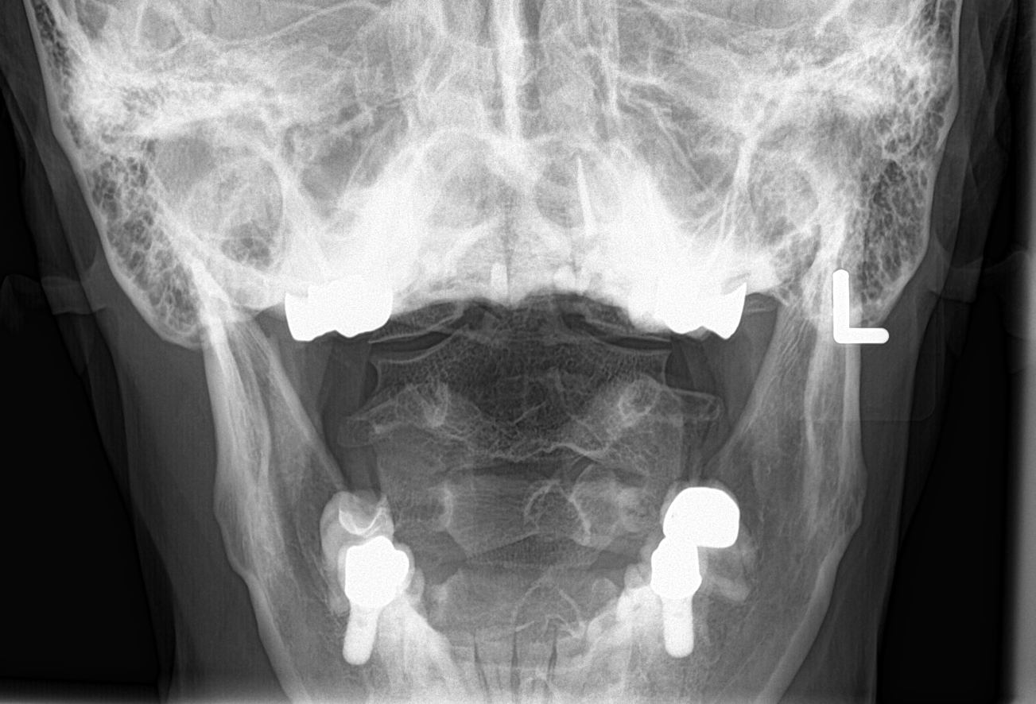

[6 of 6 positions shown; findings below may reference images not displayed]

FINDINGS: No acute bony or joint abnormality identified. Normal alignment
mineralization. Pulmonary apices are clear.
IMPRESSION: No acute abnormality.

## 2016-05-10 ENCOUNTER — Other Ambulatory Visit: Payer: Self-pay | Admitting: Family Medicine

## 2016-05-10 DIAGNOSIS — F419 Anxiety disorder, unspecified: Secondary | ICD-10-CM

## 2016-05-24 ENCOUNTER — Ambulatory Visit (INDEPENDENT_AMBULATORY_CARE_PROVIDER_SITE_OTHER): Payer: 59 | Admitting: Obstetrics and Gynecology

## 2016-05-24 ENCOUNTER — Encounter: Payer: Self-pay | Admitting: Obstetrics and Gynecology

## 2016-05-24 VITALS — BP 110/70 | HR 63 | Ht 64.0 in | Wt 170.0 lb

## 2016-05-24 DIAGNOSIS — N760 Acute vaginitis: Secondary | ICD-10-CM | POA: Diagnosis not present

## 2016-05-24 DIAGNOSIS — F3281 Premenstrual dysphoric disorder: Secondary | ICD-10-CM | POA: Diagnosis not present

## 2016-05-24 LAB — POCT WET PREP WITH KOH
Clue Cells Wet Prep HPF POC: NEGATIVE
KOH PREP POC: NEGATIVE
TRICHOMONAS UA: NEGATIVE
YEAST WET PREP PER HPF POC: NEGATIVE

## 2016-05-24 MED ORDER — PROGESTERONE MICRONIZED 100 MG PO CAPS: ORAL_CAPSULE | ORAL | 0 refills | Status: DC

## 2016-05-24 MED ORDER — CLOTRIMAZOLE-BETAMETHASONE 1-0.05 % EX CREA: 1.0000 "application " | TOPICAL_CREAM | Freq: Two times a day (BID) | CUTANEOUS | 0 refills | Status: DC

## 2016-05-24 NOTE — Progress Notes (Signed)
Chief Complaint  Patient presents with  . Vaginitis     HPI:      [name] is a 41 y.o. G0P0000 who LMP was No LMP recorded., presents today for vaginal sx including burning sensation for the past 3-4 days. She has not had increased d/c, odor. She has treated with monistat-1 twice without relief. She uses vagisil soap and dryer sheets. She is staying with her mom so her detergents may be different. She has been on abx recently. She deneis any urin sx, LBP, fevers, no new sex partners.   She has had heavy, irregular menses since IUD removed. She doesn't tolerate hormones well and has bad PMDD sx, bloating, mood changes with her cycles. She had Kyleena removed last month. We had dicussed prometrium luteal phase to see if will given any sx improvement. Pt would like to try now.   Past Medical History:  Diagnosis Date  . Allergy   . Anxiety   . Depression   . Dysmenorrhea   . Malignant melanoma of skin of chest ([place])    2018  . Menorrhagia   . Migraine     Past Surgical History:  Procedure Laterality Date  . BREAST ENHANCEMENT SURGERY    . SKIN CANCER EXCISION    . WRIST SURGERY      Family History  Problem Relation Age of Onset  . Heart disease Mother      ROS:  Review of Systems  Constitutional: Negative for fever, malaise/fatigue and weight loss.  Gastrointestinal: Negative for blood in stool, constipation, diarrhea, nausea and vomiting.  Genitourinary: Negative for dysuria, flank pain, frequency, hematuria and urgency.  Musculoskeletal: Negative for back pain.  Skin: Negative for itching and rash.  Psychiatric/Behavioral: Positive for depression. The patient is nervous/anxious.     Objective: BP 110/70   Pulse 63   Ht 5\' 4"  (1.626 m)   Wt 170 lb (77.1 kg)   BMI 29.18 kg/m    Physical Exam  Genitourinary: Vagina normal and uterus normal. There is no rash, tenderness, lesion or Bartholin's cyst on the right labia. There is no rash, tenderness,  lesion or Bartholin's cyst on the left labia. No erythema, tenderness or bleeding in the vagina. No vaginal discharge found. Right adnexum does not display mass and does not display tenderness. Left adnexum does not display mass and does not display tenderness. Cervix does not exhibit motion tenderness, discharge or friability. Uterus is not enlarged or tender.  Nursing note and vitals reviewed.   Results: Results for orders placed or performed in visit on 05/24/16 (from the past 24 hour(s))  POCT Wet Prep with KOH     Status: Normal   Collection Time: 05/24/16 10:38 AM  Result Value Ref Range   Trichomonas, UA Negative    Clue Cells Wet Prep HPF POC neg    Epithelial Wet Prep HPF POC  Few, Moderate, Many, Too numerous to count   Yeast Wet Prep HPF POC neg    Bacteria Wet Prep HPF POC  Few   RBC Wet Prep HPF POC     WBC Wet Prep HPF POC     KOH Prep POC Negative Negative     Assessment/Plan:  Acute vaginitis - Neg wet prep/exam. Question chem derm vs AV. Rx clotrimazole/betamethasone crm eRxd. Line dry underwear. F/u prn sx. Will treat for AV if sx persist. - Plan: clotrimazole-betamethasone (LOTRISONE) cream, POCT Wet Prep with KOH  PMDD (premenstrual dysphoric disorder) - Pt with issues with  hormones/cycles. Doesn't do well with BC. Had IUD removed last month. Try prometrium luteal phase to see if sx improve. Pt to f/u via phone. - Plan: progesterone (PROMETRIUM) 100 MG capsule    Meds ordered this encounter  Medications  . clotrimazole-betamethasone (LOTRISONE) cream    Sig: Apply 1 application topically 2 (two) times daily. Apply externally BID prn sx up to 2 wks    Dispense:  15 g    Refill:  0  . progesterone (PROMETRIUM) 100 MG capsule    Sig: Take 1 tablet at bedtime days 14-27 of cycle    Dispense:  14 capsule    Refill:  0     F/U  No Follow-up on file.  Pharrell Ledford B. Elisha Cooksey, PA-C 05/24/2016 10:37 AM

## 2016-05-27 ENCOUNTER — Other Ambulatory Visit: Payer: Self-pay | Admitting: Family Medicine

## 2016-05-27 DIAGNOSIS — G43009 Migraine without aura, not intractable, without status migrainosus: Secondary | ICD-10-CM

## 2016-06-16 ENCOUNTER — Ambulatory Visit (INDEPENDENT_AMBULATORY_CARE_PROVIDER_SITE_OTHER): Payer: 59 | Admitting: Physician Assistant

## 2016-06-16 ENCOUNTER — Encounter: Payer: Self-pay | Admitting: Physician Assistant

## 2016-06-16 VITALS — BP 124/60 | HR 84 | Temp 99.0°F | Resp 16 | Wt 172.0 lb

## 2016-06-16 DIAGNOSIS — F32A Depression, unspecified: Secondary | ICD-10-CM

## 2016-06-16 DIAGNOSIS — F419 Anxiety disorder, unspecified: Secondary | ICD-10-CM

## 2016-06-16 DIAGNOSIS — G44229 Chronic tension-type headache, not intractable: Secondary | ICD-10-CM | POA: Diagnosis not present

## 2016-06-16 DIAGNOSIS — F329 Major depressive disorder, single episode, unspecified: Secondary | ICD-10-CM

## 2016-06-16 DIAGNOSIS — M542 Cervicalgia: Secondary | ICD-10-CM | POA: Diagnosis not present

## 2016-06-16 DIAGNOSIS — M9901 Segmental and somatic dysfunction of cervical region: Secondary | ICD-10-CM | POA: Diagnosis not present

## 2016-06-16 MED ORDER — SERTRALINE HCL 100 MG PO TABS: 150.0000 mg | ORAL_TABLET | Freq: Every day | ORAL | 5 refills | Status: DC

## 2016-06-16 NOTE — Progress Notes (Signed)
Patient: Haley Moore Female    DOB: 10-27-1975   41 y.o.   MRN: [id number] Visit Date: 06/16/2016  Today's Provider: Trinna Post, PA-C   Chief Complaint  Patient presents with  . Anxiety  . Depression   Subjective:    Anxiety  Presents for follow-up visit. Symptoms include decreased concentration, excessive worry, malaise, nausea, nervous/anxious behavior, panic and restlessness. Patient reports no insomnia, palpitations, shortness of breath or suicidal ideas. Symptoms occur constantly. The quality of sleep is good (Almost too well).    Depression         This is a chronic problem.  The problem has been gradually worsening (Pt reports her symptoms are worsening since d/c'ing her birth control) since onset.  Associated symptoms include decreased concentration, fatigue, helplessness, hopelessness, irritable, restlessness, decreased interest and sad.  Associated symptoms include does not have insomnia, no appetite change, no body aches, no myalgias, no headaches, no indigestion and no suicidal ideas.  Past treatments include SSRIs - Selective serotonin reuptake inhibitors.  Compliance with treatment is good.  Previous treatment provided no relief relief.  Past medical history includes anxiety.    Haley Moore is a 41 y/o woman with a history of anxiety and depression presenting today for worsening of these conditions. She says she has suffered from severe premenstrual syndrome since ages 20, during which time she would have mood swings, anger, irritability. She was recently diagnosed with premenstrual dysphoric disorder by her gynecologist. She has a long history of trying to treat these symptoms from a hormonal perspective with little success. Different OCPs, NuvaRing and the IUD have been ineffective. Currently she is on Day 5 of progesterone treatment, so she is not sure if this is working. She says "I think this is due to my hormones."  She reports that she is having  intolerable depression, anxiety, irritability, and rage. She is having fatigue, poor concentration as well. 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 She reports her boss told her to take two weeks off because of poor attitude. She says "I hate everyone right now." Within the past several months, she has been separated from her husband of 9 years. She says he works as an Public relations account executive and is "very nurturing" and describes him as a great supportive guy. She says she is the reason they are separated. She has recently moved back in with her mother. She also has been treated for melanoma this year which cost her 5,000 dollars and she owed more money on taxes so she is now in debt, which is a further source of stress. When asked what her support system is, she says she has none. When asked if her father is alive, she says "probably." He is not involved in her life. She has siblings that she doesn't see very much. Until recently, she was drinking 3-4 cocktails per night most nights she was working. No smoking or drugs. No thoughts of suicide or self harm.   She has a history of multiple medications for depression. She was on Effexor that was effective but discontinued 2/2 cost. She was on Depakote but discontinued 2/2 weight gain. She was on Prozac, which she self discontinued. She has also been treated with benzodiazepines including Xanax and Klonopin. She has seen Dyanne Carrel [facility] at [place]. She went twice and said that the therapist made her angry. She denies  past diagnosis of bipolar depression. She says "I've been trying to find help for years and nobody will help me." She was on Phentermine recently but is no longer taking this.   Allergies  Allergen Reactions  . Shellfish Allergy     Current Outpatient Prescriptions:  .  clotrimazole-betamethasone (LOTRISONE) cream, Apply 1 application topically 2 (two) times daily. Apply externally  BID prn sx up to 2 wks, Disp: 15 g, Rfl: 0 .  EPIPEN 2-PAK 0.3 MG/0.3ML SOAJ injection, , Disp: , Rfl: 0 .  fluticasone (FLONASE) 50 MCG/ACT nasal spray, Place 2 sprays into both nostrils daily., Disp: 16 g, Rfl: 6 .  ibuprofen (ADVIL,MOTRIN) 600 MG tablet, , Disp: , Rfl: 0 .  progesterone (PROMETRIUM) 100 MG capsule, Take 1 tablet at bedtime days 14-27 of cycle, Disp: 14 capsule, Rfl: 0 .  sertraline (ZOLOFT) 100 MG tablet, TAKE ONE (1) TABLET BY MOUTH EVERY DAY, Disp: 30 tablet, Rfl: 5 .  SUMAtriptan (IMITREX) 50 MG tablet, TAKE ONE (1) TABLET (50MG  TOTAL) BY MOUTH ONCE; MAY REPEAT IN 2 HOURSIF HEADACHE NOT IMPROVED, Disp: 8 tablet, Rfl: 5 .  clonazePAM (KLONOPIN) 0.5 MG tablet, Take 1 tablet (0.5 mg total) by mouth 2 (two) times daily as needed for anxiety. (Patient not taking: Reported on 06/16/2016), Disp: 30 tablet, Rfl: 0  Review of Systems  Constitutional: Positive for fatigue. Negative for appetite change.  Respiratory: Negative.  Negative for shortness of breath.   Cardiovascular: Negative.  Negative for palpitations.  Gastrointestinal: Positive for nausea. Negative for abdominal distention, abdominal pain, anal bleeding, blood in stool, constipation, diarrhea, rectal pain and vomiting.  Musculoskeletal: Negative for myalgias.  Neurological: Negative for headaches.  Hematological: Does not bruise/bleed easily.  Psychiatric/Behavioral: Positive for agitation, decreased concentration and depression. Negative for self-injury, sleep disturbance and suicidal ideas. The patient is nervous/anxious. The patient does not have insomnia.     Social History  Substance Use Topics  . Smoking status: Former Smoker    Quit date: 02/07/1998  . Smokeless tobacco: Never Used  . Alcohol use No   Objective:   BP 124/60 (BP Location: Left Arm, Patient Position: Sitting, Cuff Size: Large)   Pulse 84   Temp 99 F (37.2 C) (Oral)   Resp 16   Wt 172 lb (78 kg)   LMP 05/29/2016   BMI 29.52 kg/m    Vitals:   06/16/16 1445  BP: 124/60  Pulse: 84  Resp: 16  Temp: 99 F (37.2 C)  TempSrc: Oral  Weight: 172 lb (78 kg)     Physical Exam  Constitutional: She is oriented to person, place, and time. She appears well-developed and well-nourished. She is irritable.  Alternately crying and angry in exam room.  Cardiovascular: Normal rate.   Pulmonary/Chest: Effort normal.  Neurological: She is alert and oriented to person, place, and time.  Skin: Skin is warm and dry.  Psychiatric: Her speech is normal. Judgment and thought content normal. Her mood appears anxious. Her affect is angry. She is agitated. Cognition and memory are normal. She expresses no suicidal plans and no homicidal plans.  Patient teary in office with alternating bouts of anger. She is argumentative. For example, I told her she may call here with any issues after she leaves. She responds, "Why? It's not like it's going to fix anything. What would I call here about." When I respond it's simply an option if she needs direction, she says "Why would I call to get told the same thing and  not have an answer?"        Assessment & Plan:     1. Anxiety and depression  Patient with longstanding anxiety and depression, possibly bipolar depression based on history of using Depakote, though patient ever denies having this diagnosis. There may be a hormonal component, which gynecology seems to have been trying to address for years, but I doubt that this is the sole contributing factor. Patient seems to have no real support systems and poor relationships with those around her. Concerns for worsening depression, bipolar depression, possible personality disorder. Think she needs psychiatric evaluation and she is agreeable to this. She prefers a female in [place] but will go to [facility] if necessary. Patient has contracted for safety today.  - Ambulatory referral to Psychiatry  2. Chronic anxiety  Will increase in the meantime  before getting into psych. Call if worsening and we can drop back down.   - sertraline (ZOLOFT) 100 MG tablet; Take 1.5 tablets (150 mg total) by mouth at bedtime.  Dispense: 30 tablet; Refill: 5  Return if symptoms worsen or fail to improve.  The entirety of the information documented in the History of Present Illness, Review of Systems and Physical Exam were personally obtained by me. Portions of this information were initially documented by Ashley Royalty, CMA and reviewed by me for thoroughness and accuracy.   I have spent 25 minutes with this patient, >50% of which was spent on counseling and coordination of care.      Trinna Post, PA-C  [facility]

## 2016-06-16 NOTE — Patient Instructions (Signed)

## 2016-06-17 ENCOUNTER — Other Ambulatory Visit: Payer: Self-pay | Admitting: Physician Assistant

## 2016-06-17 DIAGNOSIS — F32A Depression, unspecified: Secondary | ICD-10-CM

## 2016-06-17 DIAGNOSIS — F329 Major depressive disorder, single episode, unspecified: Secondary | ICD-10-CM

## 2016-06-17 DIAGNOSIS — F419 Anxiety disorder, unspecified: Secondary | ICD-10-CM

## 2016-06-17 MED ORDER — SERTRALINE HCL 100 MG PO TABS: ORAL_TABLET | ORAL | 3 refills | Status: DC

## 2016-06-17 NOTE — Progress Notes (Signed)
Sent in zoloft with 45 pills per month.

## 2016-06-20 ENCOUNTER — Ambulatory Visit: Payer: Self-pay | Admitting: Obstetrics and Gynecology

## 2016-06-27 ENCOUNTER — Ambulatory Visit: Payer: Self-pay | Admitting: Obstetrics and Gynecology

## 2016-06-30 DIAGNOSIS — M9901 Segmental and somatic dysfunction of cervical region: Secondary | ICD-10-CM | POA: Diagnosis not present

## 2016-06-30 DIAGNOSIS — M542 Cervicalgia: Secondary | ICD-10-CM | POA: Diagnosis not present

## 2016-06-30 DIAGNOSIS — G44229 Chronic tension-type headache, not intractable: Secondary | ICD-10-CM | POA: Diagnosis not present

## 2016-07-07 ENCOUNTER — Ambulatory Visit (INDEPENDENT_AMBULATORY_CARE_PROVIDER_SITE_OTHER): Payer: 59 | Admitting: Behavioral Health

## 2016-07-07 ENCOUNTER — Encounter: Payer: Self-pay | Admitting: Behavioral Health

## 2016-07-07 DIAGNOSIS — F3281 Premenstrual dysphoric disorder: Secondary | ICD-10-CM | POA: Diagnosis not present

## 2016-07-07 DIAGNOSIS — M9901 Segmental and somatic dysfunction of cervical region: Secondary | ICD-10-CM | POA: Diagnosis not present

## 2016-07-07 DIAGNOSIS — M542 Cervicalgia: Secondary | ICD-10-CM | POA: Diagnosis not present

## 2016-07-07 DIAGNOSIS — G44229 Chronic tension-type headache, not intractable: Secondary | ICD-10-CM | POA: Diagnosis not present

## 2016-07-07 DIAGNOSIS — F411 Generalized anxiety disorder: Secondary | ICD-10-CM
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
## 2016-07-08 DIAGNOSIS — M9901 Segmental and somatic dysfunction of cervical region: Secondary | ICD-10-CM | POA: Diagnosis not present

## 2016-07-08 DIAGNOSIS — M542 Cervicalgia: Secondary | ICD-10-CM | POA: Diagnosis not present

## 2016-07-08 DIAGNOSIS — G44229 Chronic tension-type headache, not intractable: Secondary | ICD-10-CM | POA: Diagnosis not present

## 2016-07-11 ENCOUNTER — Ambulatory Visit: Payer: Self-pay | Admitting: Obstetrics and Gynecology

## 2016-07-14 DIAGNOSIS — M9901 Segmental and somatic dysfunction of cervical region: Secondary | ICD-10-CM | POA: Diagnosis not present

## 2016-07-14 DIAGNOSIS — G44229 Chronic tension-type headache, not intractable: Secondary | ICD-10-CM | POA: Diagnosis not present

## 2016-07-14 DIAGNOSIS — M542 Cervicalgia: Secondary | ICD-10-CM | POA: Diagnosis not present

## 2016-07-15 DIAGNOSIS — M9901 Segmental and somatic dysfunction of cervical region: Secondary | ICD-10-CM | POA: Diagnosis not present

## 2016-07-15 DIAGNOSIS — M542 Cervicalgia: Secondary | ICD-10-CM | POA: Diagnosis not present

## 2016-07-15 DIAGNOSIS — G44229 Chronic tension-type headache, not intractable: Secondary | ICD-10-CM | POA: Diagnosis not present

## 2016-07-18 ENCOUNTER — Ambulatory Visit: Payer: 59 | Admitting: Behavioral Health

## 2016-07-21 ENCOUNTER — Ambulatory Visit: Payer: 59 | Admitting: Behavioral Health

## 2016-07-22 DIAGNOSIS — G44229 Chronic tension-type headache, not intractable: Secondary | ICD-10-CM | POA: Diagnosis not present

## 2016-07-22 DIAGNOSIS — M542 Cervicalgia: Secondary | ICD-10-CM | POA: Diagnosis not present

## 2016-07-22 DIAGNOSIS — M9901 Segmental and somatic dysfunction of cervical region: Secondary | ICD-10-CM | POA: Diagnosis not present

## 2016-07-28 ENCOUNTER — Telehealth: Payer: Self-pay | Admitting: Obstetrics and Gynecology

## 2016-07-28 NOTE — Telephone Encounter (Signed)
Pt is schedule for annual 08/17/16. Pt would like to try the Nuvaring until she can have an consult for getting her tubes tied.  Please advise.

## 2016-07-28 NOTE — Telephone Encounter (Signed)
Pls call pt to sched appt with MD to discuss BTL before annual. Pt can't have estrogen products due to migraines with aura (pls let her know that). Suggest condoms in the meantime. Thx.

## 2016-07-28 NOTE — Telephone Encounter (Signed)
Pt is schedule with Dr. Star Age for [facility]

## 2016-07-29 ENCOUNTER — Ambulatory Visit (INDEPENDENT_AMBULATORY_CARE_PROVIDER_SITE_OTHER): Payer: 59 | Admitting: Obstetrics and Gynecology

## 2016-07-29 ENCOUNTER — Encounter: Payer: Self-pay | Admitting: Obstetrics and Gynecology

## 2016-07-29 VITALS — BP 100/72 | HR 73 | Ht 64.0 in | Wt 178.0 lb

## 2016-07-29 DIAGNOSIS — Z01818 Encounter for other preprocedural examination: Secondary | ICD-10-CM

## 2016-07-29 DIAGNOSIS — M9901 Segmental and somatic dysfunction of cervical region: Secondary | ICD-10-CM | POA: Diagnosis not present

## 2016-07-29 DIAGNOSIS — G44229 Chronic tension-type headache, not intractable: Secondary | ICD-10-CM | POA: Diagnosis not present

## 2016-07-29 DIAGNOSIS — M542 Cervicalgia: Secondary | ICD-10-CM | POA: Diagnosis not present

## 2016-07-29 NOTE — Progress Notes (Signed)
Obstetrics & Gynecology Surgery H&P    Chief Complaint: Scheduled Surgery   History of Present Illness: Patient is a 41 y.o. G0P0000 interested in pursuing laparoscopic BTL.  She has previously tried OCP's and Mirena IUD and "did not do well with hormones".  Felts she had significant mood changes.  Currently separated from her husband.  She has not desired to conceive in the past nor does she want to conceive in the future.     Preoperative Pap: 06/12/13 Results: NIL HPV negative   Review of Systems:10 point review of systems  Past Medical History:  Past Medical History:  Diagnosis Date  . Allergy   . Anxiety   . Depression   . Dysmenorrhea   . Malignant melanoma of skin of chest ([place])    2018  . Menorrhagia   . Migraine     Past Surgical History:  Past Surgical History:  Procedure Laterality Date  . BREAST ENHANCEMENT SURGERY    . SKIN CANCER EXCISION    . WRIST SURGERY      Family History:  Family History  Problem Relation Age of Onset  . Heart disease Mother     Social History:  Social History   Social History  . Marital status: Married    Spouse name: N/A  . Number of children: 0  . Years of education: N/A   Occupational History  . Not on file.   Social History Main Topics  . Smoking status: Former Smoker    Quit date: 02/07/1998  . Smokeless tobacco: Never Used  . Alcohol use No  . Drug use: No  . Sexual activity: Yes    Birth control/ protection: None   Other Topics Concern  . Not on file   Social History Narrative   ** Merged History Encounter **        Allergies:  Allergies  Allergen Reactions  . Shellfish Allergy     Medications: Prior to Admission medications   Medication Sig Start Date End Date Taking? Authorizing Provider  EPIPEN 2-PAK 0.3 MG/0.3ML SOAJ injection  08/21/14  Yes [provider]  fluticasone (FLONASE) 50 MCG/ACT nasal spray Place 2 sprays into both nostrils daily. 03/05/15  Yes Carmon Ginsberg, PA    ibuprofen (ADVIL,MOTRIN) 600 MG tablet  04/26/14  Yes [provider]  sertraline (ZOLOFT) 100 MG tablet Take 1.5 pills (150 mg) daily. 06/17/16  Yes Trinna Post, PA-C  SUMAtriptan (IMITREX) 50 MG tablet TAKE ONE (1) TABLET (50MG  TOTAL) BY MOUTH ONCE; MAY REPEAT IN 2 HOURSIF HEADACHE NOT IMPROVED 05/27/16  Yes Carmon Ginsberg, PA    Physical Exam Vitals: Blood pressure 100/72, pulse 73, height 5\' 4"  (1.626 m), weight 178 lb (80.7 kg), last menstrual period 07/15/2016. General: NAD HEENT: normocephalic, anicteric Pulmonary: No increased work of breathing Extremities: no edema, erythema, or tenderness Neurologic: Grossly intact Psychiatric: mood appropriate, affect full  Imaging No results found.  Assessment: 41 y.o. G0P0000 presenting for scheduled laparoscopic BTL  Plan: 1) 41 y.o. G0P0000  with undesired fertility, desires permanent sterilization.  Other reversible forms of contraception were discussed with patient; she declines all other modalities. Permanent nature of as well as associated risks of the procedure discussed with patient including but not limited to: risk of regret, permanence of method, bleeding, infection, injury to surrounding organs and need for additional procedures.  Failure risk of 0.5-1% with increased risk of ectopic gestation if pregnancy occurs was also discussed with patient. Will schedule in  the near future   2) Routine postoperative instructions were reviewed with the patient and her family in detail today including the expected length of recovery and likely postoperative course.  The patient concurred with the proposed plan, giving informed written consent for the surgery today.  Patient instructed on the importance of being NPO after midnight prior to her procedure.  If warranted preoperative prophylactic antibiotics and SCDs ordered on call to the OR to meet SCIP guidelines and adhere to recommendation laid forth in [place] Number  104 May 2009  "Antibiotic Prophylaxis for Gynecologic Procedures".    3) A total of 15 minutes were spent in face-to-face contact with the patient during this encounter with over half of that time devoted to counseling and coordination of care.

## 2016-08-01 ENCOUNTER — Telehealth: Payer: Self-pay | Admitting: Obstetrics and Gynecology

## 2016-08-01 DIAGNOSIS — M9901 Segmental and somatic dysfunction of cervical region: Secondary | ICD-10-CM | POA: Diagnosis not present

## 2016-08-01 DIAGNOSIS — M542 Cervicalgia: Secondary | ICD-10-CM | POA: Diagnosis not present

## 2016-08-01 DIAGNOSIS — G44229 Chronic tension-type headache, not intractable: Secondary | ICD-10-CM | POA: Diagnosis not present

## 2016-08-01 NOTE — Telephone Encounter (Signed)
Patient is scheduled for 10/25/16.

## 2016-08-01 NOTE — Telephone Encounter (Signed)
-----   Message from Malachy Mood, MD sent at 08/01/2016  8:04 AM EDT ----- Regarding: Surgery Surgery Date:   LOS: outpatient  Surgery Booking Request Patient Full Name: Haley Moore, Haley Moore MRN: [id number]  DOB: 08/01/1975  Surgeon: Malachy Mood, MD  Requested Surgery Date and Time: 1-6 weeks Primary Diagnosis and Code: Undesired fertility Secondary Diagnosis and Code:  Surgical Procedure: laparoscopic tubal ligation L&D Notification:N/A Admission Status: same day surgery Length of Surgery: 1h Special Case Needs: none H&P: can be same day as surgery (date) Phone Interview or Office Pre-Admit: phone Interpreter: none Language: enlgish Medical Clearance: none Special Scheduling Instructions: n/a

## 2016-08-01 NOTE — Telephone Encounter (Signed)
-----   Message from Malachy Mood, MD sent at 08/01/2016  8:04 AM EDT ----- Regarding: Surgery Surgery Date:   LOS: outpatient  Surgery Booking Request Patient Full Name: Zamya, Culhane MRN: [id number]  DOB: 1975/05/20  Surgeon: Malachy Mood, MD  Requested Surgery Date and Time: 1-6 weeks Primary Diagnosis and Code: Undesired fertility Secondary Diagnosis and Code:  Surgical Procedure: laparoscopic tubal ligation L&D Notification:N/A Admission Status: same day surgery Length of Surgery: 1h Special Case Needs: none H&P: can be same day as surgery (date) Phone Interview or Office Pre-Admit: phone Interpreter: none Language: enlgish Medical Clearance: none Special Scheduling Instructions: n/a

## 2016-08-01 NOTE — Telephone Encounter (Signed)
Patient was given available OR dates for June and July. The patient is out of town the first week of July and Dr Georgianne Fick is on vacation the second week. The patient decided she may move the surgery to September and will check and call back. The patient was given my ext.

## 2016-08-02 ENCOUNTER — Telehealth: Payer: Self-pay | Admitting: Obstetrics and Gynecology

## 2016-08-02 NOTE — Telephone Encounter (Signed)
Patient is aware of Pre-admit Testing Phone Interview on 10/18/16 @ 9am-1pm.

## 2016-08-03 DIAGNOSIS — M9901 Segmental and somatic dysfunction of cervical region: Secondary | ICD-10-CM | POA: Diagnosis not present

## 2016-08-03 DIAGNOSIS — G44229 Chronic tension-type headache, not intractable: Secondary | ICD-10-CM | POA: Diagnosis not present

## 2016-08-03 DIAGNOSIS — M542 Cervicalgia: Secondary | ICD-10-CM | POA: Diagnosis not present

## 2016-08-17 ENCOUNTER — Encounter: Payer: Self-pay | Admitting: Obstetrics and Gynecology

## 2016-08-17 ENCOUNTER — Ambulatory Visit (INDEPENDENT_AMBULATORY_CARE_PROVIDER_SITE_OTHER): Payer: 59 | Admitting: Obstetrics and Gynecology

## 2016-08-17 VITALS — BP 104/64 | HR 97 | Ht 64.0 in | Wt 168.0 lb

## 2016-08-17 DIAGNOSIS — Z1239 Encounter for other screening for malignant neoplasm of breast: Secondary | ICD-10-CM

## 2016-08-17 DIAGNOSIS — Z113 Encounter for screening for infections with a predominantly sexual mode of transmission: Secondary | ICD-10-CM | POA: Diagnosis not present

## 2016-08-17 DIAGNOSIS — Z1231 Encounter for screening mammogram for malignant neoplasm of breast: Secondary | ICD-10-CM

## 2016-08-17 DIAGNOSIS — M9901 Segmental and somatic dysfunction of cervical region: Secondary | ICD-10-CM | POA: Diagnosis not present

## 2016-08-17 DIAGNOSIS — Z01419 Encounter for gynecological examination (general) (routine) without abnormal findings: Secondary | ICD-10-CM | POA: Diagnosis not present

## 2016-08-17 DIAGNOSIS — G44229 Chronic tension-type headache, not intractable: Secondary | ICD-10-CM | POA: Diagnosis not present

## 2016-08-17 DIAGNOSIS — Z124 Encounter for screening for malignant neoplasm of cervix: Secondary | ICD-10-CM

## 2016-08-17 DIAGNOSIS — M542 Cervicalgia: Secondary | ICD-10-CM | POA: Diagnosis not present

## 2016-08-17 NOTE — Progress Notes (Signed)
Chief Complaint  Patient presents with  . Gynecologic Exam     HPI:      [name] is a 41 y.o. G0P0000 who LMP was Patient's last menstrual period was 08/08/2016 (exact date)., presents today for her annual examination.  Her menses are regular every 28-30 days, lasting 4 days.  Dysmenorrhea mild, occurring first 1-2 days of flow. She does not have intermenstrual bleeding. Her PMDD sx are improved. She feels so much better without IUD (removed 4/18).  Sex activity: single partner, contraception - condoms most of the time. Planning for TL with Dr. Georgianne Fick 9/18. Last Pap: Jun 12, 2013  Results were: no abnormalities /neg HPV DNA  Hx of STDs: none  Last mammogram: never There is no FH of breast cancer. There is no known FH of ovarian cancer, but MGM had either uterine or ovar ca. No way for pt to determine dx. The patient does not do self-breast exams.  Tobacco use: The patient denies current or previous tobacco use. Alcohol use: none Exercise: moderately active  She does get adequate calcium and Vitamin D in her diet.    Past Medical History:  Diagnosis Date  . Allergy   . Anxiety   . Depression   . Dysmenorrhea   . Malignant melanoma of skin of chest ([place])    2018  . Menorrhagia   . Migraine     Past Surgical History:  Procedure Laterality Date  . BREAST ENHANCEMENT SURGERY    . SKIN CANCER EXCISION    . WRIST SURGERY      Family History  Problem Relation Age of Onset  . Heart disease Mother     Social History   Social History  . Marital status: Married    Spouse name: N/A  . Number of children: 0  . Years of education: N/A   Occupational History  . Not on file.   Social History Main Topics  . Smoking status: Former Smoker    Quit date: 02/07/1998  . Smokeless tobacco: Never Used  . Alcohol use No  . Drug use: No  . Sexual activity: Yes    Birth control/ protection: None   Other Topics Concern  . Not on file   Social History  Narrative   ** Merged History Encounter **         Current Outpatient Prescriptions:  .  EPIPEN 2-PAK 0.3 MG/0.3ML SOAJ injection, , Disp: , Rfl: 0 .  fluticasone (FLONASE) 50 MCG/ACT nasal spray, Place 2 sprays into both nostrils daily., Disp: 16 g, Rfl: 6 .  ibuprofen (ADVIL,MOTRIN) 600 MG tablet, , Disp: , Rfl: 0 .  sertraline (ZOLOFT) 100 MG tablet, Take 1.5 pills (150 mg) daily., Disp: 45 tablet, Rfl: 3 .  SUMAtriptan (IMITREX) 50 MG tablet, TAKE ONE (1) TABLET (50MG  TOTAL) BY MOUTH ONCE; MAY REPEAT IN 2 HOURSIF HEADACHE NOT IMPROVED, Disp: 8 tablet, Rfl: 5  ROS:  Review of Systems  Constitutional: Negative for fatigue, fever and unexpected weight change.  Respiratory: Negative for cough, shortness of breath and wheezing.   Cardiovascular: Negative for chest pain, palpitations and leg swelling.  Gastrointestinal: Negative for blood in stool, constipation, diarrhea, nausea and vomiting.  Endocrine: Negative for cold intolerance, heat intolerance and polyuria.  Genitourinary: Negative for dyspareunia, dysuria, flank pain, frequency, genital sores, hematuria, menstrual problem, pelvic pain, urgency, vaginal bleeding, vaginal discharge and vaginal pain.  Musculoskeletal: Negative for back pain, joint swelling and myalgias.  Skin: Negative for rash.  Neurological: Negative for dizziness, syncope, light-headedness, numbness and headaches.  Hematological: Negative for adenopathy.  Psychiatric/Behavioral: Negative for agitation, confusion, sleep disturbance and suicidal ideas. The patient is not nervous/anxious.      Objective: BP 104/64 (BP Location: Left Arm, Patient Position: Sitting, Cuff Size: Normal)   Pulse 97   Ht 5\' 4"  (1.626 m)   Wt 168 lb (76.2 kg)   LMP 08/08/2016 (Exact Date)   BMI 28.84 kg/m    Physical Exam  Constitutional: She is oriented to person, place, and time. She appears well-developed and well-nourished.  Genitourinary: Vagina normal and uterus normal.  There is no rash or tenderness on the right labia. There is no rash or tenderness on the left labia. No erythema or tenderness in the vagina. No vaginal discharge found. Right adnexum does not display mass and does not display tenderness. Left adnexum does not display mass and does not display tenderness. Cervix does not exhibit motion tenderness or polyp. Uterus is not enlarged or tender.  Neck: Normal range of motion. No thyromegaly present.  Cardiovascular: Normal rate, regular rhythm and normal heart sounds.   No murmur heard. Pulmonary/Chest: Effort normal and breath sounds normal. Right breast exhibits no mass, no nipple discharge, no skin change and no tenderness. Left breast exhibits no mass, no nipple discharge, no skin change and no tenderness.  Abdominal: Soft. There is no tenderness. There is no guarding.  Musculoskeletal: Normal range of motion.  Neurological: She is alert and oriented to person, place, and time. No cranial nerve deficit.  Psychiatric: She has a normal mood and affect. Her behavior is normal.  Vitals reviewed.    Assessment/Plan: Encounter for annual routine gynecological examination  Cervical cancer screening - Plan: IGP, Aptima HPV  Screening for STD (sexually transmitted disease) - Plan: IGP, Aptima HPV  Screening for breast cancer - Pt to sched mammo. - Plan: MM DIGITAL SCREENING BILATERAL             GYN counsel mammography screening, adequate intake of calcium and vitamin D, diet and exercise     F/U  Return in about 1 year (around 08/17/2017).  Alicia B. Copland, PA-C 08/17/2016 9:07 AM

## 2016-08-19 DIAGNOSIS — M9901 Segmental and somatic dysfunction of cervical region: Secondary | ICD-10-CM | POA: Diagnosis not present

## 2016-08-19 DIAGNOSIS — M542 Cervicalgia: Secondary | ICD-10-CM | POA: Diagnosis not present

## 2016-08-19 DIAGNOSIS — G44229 Chronic tension-type headache, not intractable: Secondary | ICD-10-CM | POA: Diagnosis not present

## 2016-08-20 LAB — IGP, APTIMA HPV
HPV APTIMA: NEGATIVE
PAP SMEAR COMMENT: 0

## 2016-08-25 DIAGNOSIS — M9901 Segmental and somatic dysfunction of cervical region: Secondary | ICD-10-CM | POA: Diagnosis not present

## 2016-08-25 DIAGNOSIS — G44229 Chronic tension-type headache, not intractable: Secondary | ICD-10-CM | POA: Diagnosis not present

## 2016-08-25 DIAGNOSIS — M542 Cervicalgia: Secondary | ICD-10-CM | POA: Diagnosis not present

## 2016-09-01 DIAGNOSIS — M9901 Segmental and somatic dysfunction of cervical region: Secondary | ICD-10-CM | POA: Diagnosis not present

## 2016-09-01 DIAGNOSIS — G44229 Chronic tension-type headache, not intractable: Secondary | ICD-10-CM | POA: Diagnosis not present

## 2016-09-01 DIAGNOSIS — M542 Cervicalgia: Secondary | ICD-10-CM | POA: Diagnosis not present

## 2016-10-01 ENCOUNTER — Other Ambulatory Visit: Payer: Self-pay | Admitting: Physician Assistant

## 2016-10-01 DIAGNOSIS — F329 Major depressive disorder, single episode, unspecified: Secondary | ICD-10-CM

## 2016-10-01 DIAGNOSIS — F419 Anxiety disorder, unspecified: Principal | ICD-10-CM

## 2016-10-06 DIAGNOSIS — G44229 Chronic tension-type headache, not intractable: Secondary | ICD-10-CM | POA: Diagnosis not present

## 2016-10-06 DIAGNOSIS — M542 Cervicalgia: Secondary | ICD-10-CM | POA: Diagnosis not present

## 2016-10-06 DIAGNOSIS — M9901 Segmental and somatic dysfunction of cervical region: Secondary | ICD-10-CM | POA: Diagnosis not present

## 2016-10-13 DIAGNOSIS — M542 Cervicalgia: Secondary | ICD-10-CM | POA: Diagnosis not present

## 2016-10-13 DIAGNOSIS — G44229 Chronic tension-type headache, not intractable: Secondary | ICD-10-CM | POA: Diagnosis not present

## 2016-10-13 DIAGNOSIS — M9901 Segmental and somatic dysfunction of cervical region: Secondary | ICD-10-CM | POA: Diagnosis not present

## 2016-10-18 ENCOUNTER — Encounter
Admission: RE | Admit: 2016-10-18 | Discharge: 2016-10-18 | Disposition: A | Discharge status: Home / Self Care | Payer: BLUE CROSS/BLUE SHIELD | Source: Ambulatory Visit | Attending: Obstetrics and Gynecology | Admitting: Obstetrics and Gynecology

## 2016-10-18 DIAGNOSIS — M9901 Segmental and somatic dysfunction of cervical region: Secondary | ICD-10-CM | POA: Diagnosis not present

## 2016-10-18 DIAGNOSIS — G44229 Chronic tension-type headache, not intractable: Secondary | ICD-10-CM | POA: Diagnosis not present

## 2016-10-18 DIAGNOSIS — M542 Cervicalgia: Secondary | ICD-10-CM | POA: Diagnosis not present

## 2016-10-18 NOTE — Patient Instructions (Signed)
Your procedure is scheduled on: 10-25-16  Report to Same Day Surgery 2nd floor medical mall [facility] Entrance-take elevator on left to 2nd floor.  Check in with surgery information desk.) To find out your arrival time please call [phone] between 1PM - 3PM on 10-24-16  Remember: Instructions that are not followed completely may result in serious medical risk, up to and including death, or upon the discretion of your surgeon and anesthesiologist your surgery may need to be rescheduled.    _x___ 1. Do not eat food after midnight the night before your procedure. You may drink clear liquids up to 2 hours before you are scheduled to arrive at the hospital for your procedure.  Do not drink clear liquids within 2 hours of your scheduled arrival to the hospital.  Clear liquids include  --Water or Apple juice without pulp  --Clear carbohydrate beverage such as ClearFast or Gatorade  --Black Coffee or Clear Tea (No milk, no creamers, do not add anything to the coffee or Tea)  Type 1 and type 2 diabetics should only drink water.  No gum chewing or hard candies.     __x__ 2. No Alcohol for 24 hours before or after surgery.   __x__3. No Smoking for 24 prior to surgery.   ____  4. Bring all medications with you on the day of surgery if instructed.    __x__ 5. Notify your doctor if there is any change in your medical condition     (cold, fever, infections).     Do not wear jewelry, make-up, hairpins, clips or nail polish.  Do not wear lotions, powders, or perfumes. You may wear deodorant.  Do not shave 48 hours prior to surgery. Men may shave face and neck.  Do not bring valuables to the hospital.    [facility] is not responsible for any belongings or valuables.               Contacts, dentures or bridgework may not be worn into surgery.  Leave your suitcase in the car. After surgery it may be brought to your room.  For patients admitted to the hospital, discharge time is determined by  your treatment team.   Patients discharged the day of surgery will not be allowed to drive home.  You will need someone to drive you home and stay with you the night of your procedure.    Please read over the following fact sheets that you were given:    _x___ Take anti-hypertensive listed below, cardiac, seizure, asthma, anti-reflux and psychiatric medicines. These include:  1. NONE  2.  3.  4.  5.  6.  ____Fleets enema or Magnesium Citrate as directed.   ____ Use CHG Soap or sage wipes as directed on instruction sheet   ____ Use inhalers on the day of surgery and bring to hospital day of surgery  ____ Stop Metformin and Janumet 2 days prior to surgery.    ____ Take 1/2 of usual insulin dose the night before surgery and none on the morning     surgery.   ____ Follow recommendations from Cardiologist, Pulmonologist or PCP regarding          stopping Aspirin, Coumadin, Plavix ,Eliquis, Effient, or Pradaxa, and Pletal.  X____Stop Anti-inflammatories such as Advil, Aleve, IBUPROFEN , Motrin, Naproxen, Naprosyn, Goodies powders or aspirin products NOW-OK to take Tylenol    _x___ Stop supplements until after surgery-STOP COLLAGEN NOW-MAY RESUME AFTER SURGERY   ____ Bring C-Pap to the  hospital.

## 2016-10-20 DIAGNOSIS — G44229 Chronic tension-type headache, not intractable: Secondary | ICD-10-CM | POA: Diagnosis not present

## 2016-10-20 DIAGNOSIS — M542 Cervicalgia: Secondary | ICD-10-CM | POA: Diagnosis not present

## 2016-10-20 DIAGNOSIS — M9901 Segmental and somatic dysfunction of cervical region: Secondary | ICD-10-CM | POA: Diagnosis not present

## 2016-10-24 DIAGNOSIS — M9901 Segmental and somatic dysfunction of cervical region: Secondary | ICD-10-CM | POA: Diagnosis not present

## 2016-10-24 DIAGNOSIS — M542 Cervicalgia: Secondary | ICD-10-CM | POA: Diagnosis not present

## 2016-10-24 DIAGNOSIS — G44229 Chronic tension-type headache, not intractable: Secondary | ICD-10-CM | POA: Diagnosis not present

## 2016-10-25 ENCOUNTER — Ambulatory Visit: Payer: 59 | Admitting: Registered Nurse

## 2016-10-25 ENCOUNTER — Observation Stay
Admission: RE | Admit: 2016-10-25 | Discharge: 2016-10-26 | Disposition: A | Discharge status: Home / Self Care | Payer: 59 | Source: Ambulatory Visit | Attending: Obstetrics and Gynecology | Admitting: Obstetrics and Gynecology

## 2016-10-25 ENCOUNTER — Encounter
Admission: RE | Disposition: A | Discharge status: Home / Self Care | Payer: Self-pay | Source: Ambulatory Visit | Attending: Obstetrics and Gynecology

## 2016-10-25 DIAGNOSIS — N946 Dysmenorrhea, unspecified: Secondary | ICD-10-CM | POA: Insufficient documentation

## 2016-10-25 DIAGNOSIS — Z87891 Personal history of nicotine dependence: Secondary | ICD-10-CM | POA: Diagnosis not present

## 2016-10-25 DIAGNOSIS — G8918 Other acute postprocedural pain: Secondary | ICD-10-CM | POA: Diagnosis present

## 2016-10-25 DIAGNOSIS — F419 Anxiety disorder, unspecified: Secondary | ICD-10-CM | POA: Diagnosis not present

## 2016-10-25 DIAGNOSIS — Z85820 Personal history of malignant melanoma of skin: Secondary | ICD-10-CM | POA: Insufficient documentation

## 2016-10-25 DIAGNOSIS — Z809 Family history of malignant neoplasm, unspecified: Secondary | ICD-10-CM | POA: Diagnosis not present

## 2016-10-25 DIAGNOSIS — G43909 Migraine, unspecified, not intractable, without status migrainosus: Secondary | ICD-10-CM | POA: Insufficient documentation

## 2016-10-25 DIAGNOSIS — F329 Major depressive disorder, single episode, unspecified: Secondary | ICD-10-CM | POA: Diagnosis not present

## 2016-10-25 DIAGNOSIS — Z8249 Family history of ischemic heart disease and other diseases of the circulatory system: Secondary | ICD-10-CM | POA: Insufficient documentation

## 2016-10-25 DIAGNOSIS — Z302 Encounter for sterilization: Principal | ICD-10-CM | POA: Insufficient documentation

## 2016-10-25 DIAGNOSIS — Z9851 Tubal ligation status: Secondary | ICD-10-CM

## 2016-10-25 HISTORY — PX: LAPAROSCOPIC TUBAL LIGATION: SHX1937

## 2016-10-25 SURGERY — LIGATION, FALLOPIAN TUBE, LAPAROSCOPIC
Anesthesia: General

## 2016-10-25 MED ORDER — FAMOTIDINE 20 MG PO TABS
20.0000 mg | ORAL_TABLET | Freq: Once | ORAL | Status: AC
  Administered 2016-10-25: 20 mg via ORAL

## 2016-10-25 MED ORDER — SUGAMMADEX SODIUM 200 MG/2ML IV SOLN
INTRAVENOUS | Status: AC
  Filled 2016-10-25: qty 2

## 2016-10-25 MED ORDER — LACTATED RINGERS IV SOLN
INTRAVENOUS | Status: DC
  Administered 2016-10-25 (×2): via INTRAVENOUS

## 2016-10-25 MED ORDER — OXYCODONE-ACETAMINOPHEN 5-325 MG PO TABS
ORAL_TABLET | ORAL | Status: AC
  Filled 2016-10-25: qty 1

## 2016-10-25 MED ORDER — FENTANYL CITRATE (PF) 100 MCG/2ML IJ SOLN
INTRAMUSCULAR | Status: AC
  Administered 2016-10-25: 25 ug via INTRAVENOUS
  Filled 2016-10-25: qty 2

## 2016-10-25 MED ORDER — FENTANYL CITRATE (PF) 100 MCG/2ML IJ SOLN
INTRAMUSCULAR | Status: AC
  Filled 2016-10-25: qty 2

## 2016-10-25 MED ORDER — ROCURONIUM BROMIDE 100 MG/10ML IV SOLN
INTRAVENOUS | Status: DC | PRN
  Administered 2016-10-25: 5 mg via INTRAVENOUS
  Administered 2016-10-25: 35 mg via INTRAVENOUS

## 2016-10-25 MED ORDER — ONDANSETRON HCL 4 MG/2ML IJ SOLN
4.0000 mg | Freq: Once | INTRAMUSCULAR | Status: AC | PRN
Start: 2016-10-25 — End: 2016-10-25
  Administered 2016-10-25: 4 mg via INTRAVENOUS

## 2016-10-25 MED ORDER — ONDANSETRON HCL 4 MG/2ML IJ SOLN: 4.0000 mg | Freq: Four times a day (QID) | INTRAMUSCULAR | Status: DC | PRN

## 2016-10-25 MED ORDER — ROCURONIUM BROMIDE 50 MG/5ML IV SOLN
INTRAVENOUS | Status: AC
  Filled 2016-10-25: qty 1

## 2016-10-25 MED ORDER — SUGAMMADEX SODIUM 200 MG/2ML IV SOLN
INTRAVENOUS | Status: DC | PRN
  Administered 2016-10-25: 160 mg via INTRAVENOUS

## 2016-10-25 MED ORDER — OXYCODONE-ACETAMINOPHEN 5-325 MG PO TABS
1.0000 | ORAL_TABLET | ORAL | Status: DC | PRN
  Administered 2016-10-25: 2 via ORAL
  Filled 2016-10-25: qty 2

## 2016-10-25 MED ORDER — ONDANSETRON HCL 4 MG/2ML IJ SOLN
INTRAMUSCULAR | Status: DC | PRN
  Administered 2016-10-25: 4 mg via INTRAVENOUS

## 2016-10-25 MED ORDER — FAMOTIDINE 20 MG PO TABS
ORAL_TABLET | ORAL | Status: AC
  Administered 2016-10-25: 20 mg via ORAL
  Filled 2016-10-25: qty 1

## 2016-10-25 MED ORDER — MIDAZOLAM HCL 2 MG/2ML IJ SOLN
INTRAMUSCULAR | Status: DC | PRN
  Administered 2016-10-25: 2 mg via INTRAVENOUS

## 2016-10-25 MED ORDER — LIDOCAINE HCL (CARDIAC) 20 MG/ML IV SOLN
INTRAVENOUS | Status: DC | PRN
  Administered 2016-10-25: 40 mg via INTRAVENOUS

## 2016-10-25 MED ORDER — FENTANYL CITRATE (PF) 100 MCG/2ML IJ SOLN
25.0000 ug | INTRAMUSCULAR | Status: AC | PRN
  Administered 2016-10-25 (×6): 25 ug via INTRAVENOUS

## 2016-10-25 MED ORDER — ONDANSETRON HCL 4 MG/2ML IJ SOLN
INTRAMUSCULAR | Status: AC
  Filled 2016-10-25: qty 2

## 2016-10-25 MED ORDER — KETOROLAC TROMETHAMINE 30 MG/ML IJ SOLN
30.0000 mg | Freq: Four times a day (QID) | INTRAMUSCULAR | Status: DC
  Administered 2016-10-26 (×2): 30 mg via INTRAVENOUS
  Filled 2016-10-25 (×2): qty 1

## 2016-10-25 MED ORDER — OXYCODONE-ACETAMINOPHEN 5-325 MG PO TABS
1.0000 | ORAL_TABLET | Freq: Four times a day (QID) | ORAL | Status: DC | PRN
  Administered 2016-10-25: 1 via ORAL

## 2016-10-25 MED ORDER — DEXTROSE-NACL 5-0.45 % IV SOLN
INTRAVENOUS | Status: DC
  Administered 2016-10-25 – 2016-10-26 (×2): via INTRAVENOUS

## 2016-10-25 MED ORDER — IBUPROFEN 600 MG PO TABS: 600.0000 mg | ORAL_TABLET | Freq: Four times a day (QID) | ORAL | 0 refills | Status: DC | PRN

## 2016-10-25 MED ORDER — KETOROLAC TROMETHAMINE 30 MG/ML IJ SOLN: 30.0000 mg | Freq: Four times a day (QID) | INTRAMUSCULAR | Status: DC

## 2016-10-25 MED ORDER — MORPHINE SULFATE (PF) 2 MG/ML IV SOLN
1.0000 mg | INTRAVENOUS | Status: DC | PRN
  Administered 2016-10-25: 2 mg via INTRAVENOUS
  Filled 2016-10-25: qty 1

## 2016-10-25 MED ORDER — PROMETHAZINE HCL 25 MG/ML IJ SOLN
12.5000 mg | Freq: Four times a day (QID) | INTRAMUSCULAR | Status: DC | PRN
  Administered 2016-10-25: 12.5 mg via INTRAVENOUS
  Filled 2016-10-25: qty 1

## 2016-10-25 MED ORDER — MIDAZOLAM HCL 2 MG/2ML IJ SOLN
INTRAMUSCULAR | Status: AC
  Filled 2016-10-25: qty 2

## 2016-10-25 MED ORDER — BUPIVACAINE HCL 0.5 % IJ SOLN
INTRAMUSCULAR | Status: DC | PRN
  Administered 2016-10-25: 18 mL

## 2016-10-25 MED ORDER — DEXAMETHASONE SODIUM PHOSPHATE 10 MG/ML IJ SOLN
INTRAMUSCULAR | Status: AC
  Filled 2016-10-25: qty 1

## 2016-10-25 MED ORDER — OXYCODONE-ACETAMINOPHEN 5-325 MG PO TABS: 1.0000 | ORAL_TABLET | Freq: Four times a day (QID) | ORAL | 0 refills | Status: DC | PRN

## 2016-10-25 MED ORDER — KETOROLAC TROMETHAMINE 30 MG/ML IJ SOLN
INTRAMUSCULAR | Status: AC
  Administered 2016-10-25: 30 mg via INTRAVENOUS
  Filled 2016-10-25: qty 1

## 2016-10-25 MED ORDER — FENTANYL CITRATE (PF) 100 MCG/2ML IJ SOLN
INTRAMUSCULAR | Status: DC | PRN
  Administered 2016-10-25 (×2): 50 ug via INTRAVENOUS

## 2016-10-25 MED ORDER — KETOROLAC TROMETHAMINE 30 MG/ML IJ SOLN
30.0000 mg | Freq: Once | INTRAMUSCULAR | Status: AC
  Administered 2016-10-25: 30 mg via INTRAVENOUS

## 2016-10-25 MED ORDER — PROPOFOL 10 MG/ML IV BOLUS
INTRAVENOUS | Status: DC | PRN
  Administered 2016-10-25: 150 mg via INTRAVENOUS

## 2016-10-25 MED ORDER — MENTHOL 3 MG MT LOZG
1.0000 | LOZENGE | OROMUCOSAL | Status: DC | PRN
  Filled 2016-10-25: qty 9

## 2016-10-25 MED ORDER — BUPIVACAINE HCL (PF) 0.5 % IJ SOLN
INTRAMUSCULAR | Status: AC
  Filled 2016-10-25: qty 30

## 2016-10-25 MED ORDER — EPHEDRINE SULFATE 50 MG/ML IJ SOLN
INTRAMUSCULAR | Status: DC | PRN
  Administered 2016-10-25 (×2): 5 mg via INTRAVENOUS

## 2016-10-25 MED ORDER — DEXAMETHASONE SODIUM PHOSPHATE 10 MG/ML IJ SOLN
INTRAMUSCULAR | Status: DC | PRN
  Administered 2016-10-25: 5 mg via INTRAVENOUS

## 2016-10-25 MED ORDER — PROPOFOL 10 MG/ML IV BOLUS
INTRAVENOUS | Status: AC
  Filled 2016-10-25: qty 20

## 2016-10-25 MED ORDER — LIDOCAINE HCL (PF) 2 % IJ SOLN
INTRAMUSCULAR | Status: AC
  Filled 2016-10-25: qty 4

## 2016-10-25 MED ORDER — OXYCODONE-ACETAMINOPHEN 5-325 MG PO TABS
ORAL_TABLET | ORAL | Status: AC
  Administered 2016-10-25: 2 via ORAL
  Filled 2016-10-25: qty 2

## 2016-10-25 MED ORDER — OXYCODONE-ACETAMINOPHEN 5-325 MG PO TABS
2.0000 | ORAL_TABLET | Freq: Once | ORAL | Status: AC
  Administered 2016-10-25: 2 via ORAL

## 2016-10-25 MED ORDER — EPHEDRINE SULFATE 50 MG/ML IJ SOLN
INTRAMUSCULAR | Status: AC
  Filled 2016-10-25: qty 1

## 2016-10-25 SURGICAL SUPPLY — 22 items
BLADE SURG SZ11 CARB STEEL (BLADE) ×3 IMPLANT
CATH ROBINSON RED A/P 16FR (CATHETERS) ×3 IMPLANT
CHLORAPREP W/TINT 26ML (MISCELLANEOUS) ×3 IMPLANT
DERMABOND ADVANCED (GAUZE/BANDAGES/DRESSINGS) ×2
DERMABOND ADVANCED .7 DNX12 (GAUZE/BANDAGES/DRESSINGS) ×1 IMPLANT
DRSG TEGADERM 2-3/8X2-3/4 SM (GAUZE/BANDAGES/DRESSINGS) ×3 IMPLANT
GLOVE BIO SURGEON STRL SZ7 (GLOVE) ×9 IMPLANT
GLOVE INDICATOR 7.5 STRL GRN (GLOVE) ×9 IMPLANT
GOWN STRL REUS W/ TWL LRG LVL3 (GOWN DISPOSABLE) ×2 IMPLANT
GOWN STRL REUS W/TWL LRG LVL3 (GOWN DISPOSABLE) ×4
KIT DISPOSABLE FALLOPE RING (Ring) ×3 IMPLANT
KIT PINK PAD W/HEAD ARE REST (MISCELLANEOUS) ×3
KIT PINK PAD W/HEAD ARM REST (MISCELLANEOUS) ×1 IMPLANT
KIT RM TURNOVER CYSTO AR (KITS) ×3 IMPLANT
LABEL OR SOLS (LABEL) ×3 IMPLANT
NS IRRIG 500ML POUR BTL (IV SOLUTION) ×3 IMPLANT
PACK GYN LAPAROSCOPIC (MISCELLANEOUS) ×3 IMPLANT
PAD OB MATERNITY 4.3X12.25 (PERSONAL CARE ITEMS) ×3 IMPLANT
PAD PREP 24X41 OB/GYN DISP (PERSONAL CARE ITEMS) ×3 IMPLANT
SUT MNCRL AB 4-0 PS2 18 (SUTURE) ×3 IMPLANT
TROCAR XCEL NON-BLD 5MMX100MML (ENDOMECHANICALS) ×3 IMPLANT
TUBING INSUFFLATOR HI FLOW (MISCELLANEOUS) ×3 IMPLANT

## 2016-10-25 NOTE — OR Nursing (Signed)
Dr. Georgianne Fick notified of pain level 10/10 toradol ordered , no relief from toradol , percocet 1 po adm.

## 2016-10-25 NOTE — OR Nursing (Signed)
Patient getting no relief from meds warm compress and a pillow applied to help ease cramps

## 2016-10-25 NOTE — Anesthesia Post-op Follow-up Note (Signed)
Anesthesia QCDR form completed.        

## 2016-10-25 NOTE — Progress Notes (Signed)
Dr. Georgianne Fick in to evaluate pt.Marland Kitchen

## 2016-10-25 NOTE — OR Nursing (Signed)
Patient vomitted after taking p.o. meds  Med given patient is resting still c/o cramps

## 2016-10-25 NOTE — Op Note (Signed)
Preoperative Diagnosis: 1) 41 y.o. with undesired fertility  Postoperative Diagnosis: 1) 41 y.o. with undesired fertility   Operation Performed: Laparoscopic bilateral tubal ligation via falope ring application  Indication: 40 y.o. G0P0000  with undesired fertility, desires permanent sterilization.  Other reversible forms of contraception were discussed with patient; she declines all other modalities. Permanent nature of as well as associated risks of the procedure discussed with patient including but not limited to: risk of regret, permanence of method, bleeding, infection, injury to surrounding organs and need for additional procedures.  Failure risk of 0.5-1% with increased risk of ectopic gestation if pregnancy occurs was also discussed with patient.    Surgeon: Malachy Mood, MD  Anesthesia: Choice  Preoperative Antibiotics: none  Estimated Blood Loss: minimal  IV Fluids: 621mL  Urine Output:: 25mL  Drains or Tubes: none  Implants: none  Specimens Removed: none  Complications: none  Intraoperative Findings: Normal tubes, ovaries, and uterus.  Good 1cm knuckle of tube applied within each falope ring.  Patient Condition: stable  Procedure in Detail:  Patient was taken to the operating room where she was administered general anesthesia.  She was positioned in the dorsal lithotomy position utilizing Allen stirups, prepped and draped in the usual sterile fashion.  Prior to proceeding with procedure a time out was performed.  Attention was turned to the patient's pelvis.  A red rubber catheter was used to empty the patient's bladder.  An operative speculum was placed to allow visualization of the cervix.  The anterior lip of the cervix was grasped with a single tooth tenaculum, and a Hulka tenaculum was placed to allow manipulation of the uterus.  The operative speculum and single tooth tenaculum were then removed.  Attention was turned to the patient's abdomen.  The umbilicus  was infiltrated with 1% Sensorcaine, before making a stab incision using an 11 blade scalpel.  A 94mm Excel trocar was then used to gain direct entry into the peritoneal cavity utilizing the camera to visualize progress of the trocar during placement.  Once peritoneal entry had been achieved, insufflation was started and pneumoperitoneum established at a pressure of 24mmHg.   General inspection of the abdomen revealed the above noted findings.  A spot 2cm midline above the pubic symphysis was injected with 1% Sensorcaine and a stab incision was made using an 11 blade scalpel.  The 46mm falope ring trocar was place suprapubic through this incision under direct visualization.  The right tube was identified and grasped in a mid-isthmic portion using the falope ring application.  The falope ring was applied with a good 1cm knuckle of blanching tube noted within the falope ring after application.  The left tube was identified and a second falope ring was applied in a similar fashion.  Pneumoperitoneum was evacuated.  The trocars were removed.  The 50mm trocar site was closed with 4-0 Monocryl in a subcuticular fashion.  All trocar sites were then dressed with surgical skin glue.  The Hulka tenaculum was removed.  Sponge needle and instrument counts were correct time two.  The patient tolerated the procedure well and was taken to the recovery room in stable condition.

## 2016-10-25 NOTE — Progress Notes (Signed)
Dr. Georgianne Fick notified pain remains 10/10 after toradol and percocet adm. , pt. To be admitted for pain control.

## 2016-10-25 NOTE — Discharge Instructions (Signed)

## 2016-10-25 NOTE — Anesthesia Preprocedure Evaluation (Signed)
Anesthesia Evaluation  Patient identified by MRN, date of birth, ID band Patient awake    Reviewed: Allergy & Precautions, NPO status , Patient's Chart, lab work & pertinent test results  Airway Mallampati: I       Dental  (+) Teeth Intact   Pulmonary neg pulmonary ROS, former smoker,    breath sounds clear to auscultation       Cardiovascular Exercise Tolerance: Good  Rhythm:Regular     Neuro/Psych  Headaches, Anxiety Depression    GI/Hepatic negative GI ROS, Neg liver ROS,   Endo/Other  negative endocrine ROS  Renal/GU negative Renal ROS     Musculoskeletal negative musculoskeletal ROS (+)   Abdominal Normal abdominal exam  (+)   Peds negative pediatric ROS (+)  Hematology negative hematology ROS (+)   Anesthesia Other Findings   Reproductive/Obstetrics                             Anesthesia Physical Anesthesia Plan  ASA: I  Anesthesia Plan: General   Post-op Pain Management:    Induction: Intravenous  PONV Risk Score and Plan: 1 and Ondansetron and Dexamethasone  Airway Management Planned: Oral ETT  Additional Equipment:   Intra-op Plan:   Post-operative Plan: Extubation in OR  Informed Consent: I have reviewed the patients History and Physical, chart, labs and discussed the procedure including the risks, benefits and alternatives for the proposed anesthesia with the patient or authorized representative who has indicated his/her understanding and acceptance.     Plan Discussed with: CRNA  Anesthesia Plan Comments:         Anesthesia Quick Evaluation

## 2016-10-25 NOTE — Transfer of Care (Signed)
Immediate Anesthesia Transfer of Care Note  Patient: Haley Moore  Procedure(s) Performed: Procedure(s): LAPAROSCOPIC TUBAL LIGATION (N/A)  Patient Location: PACU  Anesthesia Type:General  Level of Consciousness: awake, alert  and oriented  Airway & Oxygen Therapy: Patient Spontanous Breathing and Patient connected to face mask oxygen  Post-op Assessment: Report given to RN and Post -op Vital signs reviewed and stable  Post vital signs: Reviewed and stable  Last Vitals:  Vitals:   10/25/16 1251 10/25/16 1442  BP: 119/79 129/80  Pulse: 63 74  Resp: 17 10  Temp: 36.9 C (!) 36 C  SpO2: 97% 100%    Last Pain:  Vitals:   10/25/16 1442  TempSrc: Temporal         Complications: No apparent anesthesia complications

## 2016-10-25 NOTE — H&P (Signed)
Obstetrics & Gynecology Surgery H&P    Chief Complaint: Scheduled Surgery   History of Present Illness: Patient is a 41 y.o. G0P0000 presenting for scheduled laparoscopic BTL, for the treatment or further evaluation of undesired fertility.   Prior Treatments prior to proceeding with surgery include: several attempts at hormonal contraception but patient intolerant of these secondary to mood changes.  Review of Systems:10 point review of systems  Past Medical History:  Past Medical History:  Diagnosis Date  . Allergy   . Anxiety   . Depression   . Dysmenorrhea   . Malignant melanoma of skin of chest ([place])    2018  . Menorrhagia   . Migraine     Past Surgical History:  Past Surgical History:  Procedure Laterality Date  . BREAST ENHANCEMENT SURGERY    . SKIN CANCER EXCISION    . WRIST SURGERY      Family History:  Family History  Problem Relation Age of Onset  . Heart disease Mother   . Uterine cancer Maternal Grandmother 79       vs ovar ca    Social History:  Social History   Social History  . Marital status: Married    Spouse name: N/A  . Number of children: 0  . Years of education: N/A   Occupational History  . Not on file.   Social History Main Topics  . Smoking status: Former Smoker    Types: Cigarettes    Quit date: 02/07/1998  . Smokeless tobacco: Never Used     Comment: 1 PACK WEEKLY  . Alcohol use 0.0 oz/week     Comment: RARE  . Drug use: No  . Sexual activity: Yes    Birth control/ protection: None   Other Topics Concern  . Not on file   Social History Narrative   ** Merged History Encounter **        Allergies:  Allergies  Allergen Reactions  . Shellfish Allergy     Medications: Prior to Admission medications   Medication Sig Start Date End Date Taking? Authorizing Provider  cetirizine (ZYRTEC) 10 MG tablet Take 10 mg by mouth as needed for allergies.    [provider]  COLLAGEN PO Take 1 tablet by mouth daily.     [provider]  EPIPEN 2-PAK 0.3 MG/0.3ML SOAJ injection  08/21/14   [provider]  fluticasone (FLONASE) 50 MCG/ACT nasal spray Place 2 sprays into both nostrils daily. Patient taking differently: Place 2 sprays into both nostrils as needed.  03/05/15   Carmon Ginsberg, PA  ibuprofen (ADVIL,MOTRIN) 600 MG tablet  04/26/14   [provider]  Multiple Vitamin (MULTIVITAMIN) tablet Take 1 tablet by mouth daily.    [provider]  sertraline (ZOLOFT) 100 MG tablet TAKE 1 AND 1/2 TABLETS BY MOUTH DAILY *INS REQUIRES 90-DAY SUPPLY* Patient taking differently: TAKE 1 TABLET DAILY-BEDTIME 10/03/16   Carmon Ginsberg, PA  SUMAtriptan (IMITREX) 50 MG tablet TAKE ONE (1) TABLET (50MG  TOTAL) BY MOUTH ONCE; MAY REPEAT IN 2 HOURSIF HEADACHE NOT IMPROVED 05/27/16   Carmon Ginsberg, PA    Physical Exam Vitals: Last menstrual period 09/28/2016. General: NAD HEENT: normocephalic, anicteric Pulmonary: No increased work of breathing Cardiovascular: RRR, distal pulses 2+ Abdomen: soft, non-tender Genitourinary: deferred Extremities: no edema, erythema, or tenderness Neurologic: Grossly intact Psychiatric: mood appropriate, affect full  Imaging No results found.  Assessment: 41 y.o. G0P0000 presenting for scheduled laparoscopic BTL  Plan: 1) 41 y.o. G0P0000  with undesired  fertility, desires permanent sterilization.  Other reversible forms of contraception were discussed with patient; she declines all other modalities. Permanent nature of as well as associated risks of the procedure discussed with patient including but not limited to: risk of regret, permanence of method, bleeding, infection, injury to surrounding organs and need for additional procedures.  Failure risk of 0.5-1% with increased risk of ectopic gestation if pregnancy occurs was also discussed with patient.    2) Routine postoperative instructions were reviewed with the patient and her family in detail  today including the expected length of recovery and likely postoperative course.  The patient concurred with the proposed plan, giving informed written consent for the surgery today.  Patient instructed on the importance of being NPO after midnight prior to her procedure.  If warranted preoperative prophylactic antibiotics and SCDs ordered on call to the OR to meet SCIP guidelines and adhere to recommendation laid forth in [place] Number 104 May 2009  "Antibiotic Prophylaxis for Gynecologic Procedures".

## 2016-10-25 NOTE — Anesthesia Procedure Notes (Signed)
Procedure Name: Intubation Date/Time: 10/25/2016 1:36 PM Performed by: Hedda Slade Pre-anesthesia Checklist: Patient identified, Emergency Drugs available, Suction available, Patient being monitored and Timeout performed Patient Re-evaluated:Patient Re-evaluated prior to induction Oxygen Delivery Method: Circle system utilized Preoxygenation: Pre-oxygenation with 100% oxygen Induction Type: IV induction Ventilation: Mask ventilation without difficulty Laryngoscope Size: Mac and 3 Grade View: Grade II Tube type: Oral Tube size: 7.0 mm Number of attempts: 1 Airway Equipment and Method: Stylet Placement Confirmation: ETT inserted through vocal cords under direct vision,  positive ETCO2 and breath sounds checked- equal and bilateral Secured at: 21 cm Tube secured with: Tape Dental Injury: Teeth and Oropharynx as per pre-operative assessment

## 2016-10-25 NOTE — Progress Notes (Signed)
Obstetric and Gynecology  Subjective  Patient with continued pain postoperatively.  Described as cramping BLQ pain, like really bad menstrual cramps.  Had emesis after taking percocet, nausea now resolved after IV Zofran.    Objective   Vitals:   10/25/16 1622 10/25/16 1802  BP: 137/81 135/89  Pulse: (!) 58 76  Resp: 16 18  Temp: (!) 97 F (36.1 C) (!) 97.2 F (36.2 C)  SpO2: 100% 100%     Intake/Output Summary (Last 24 hours) at 10/25/16 1813 Last data filed at 10/25/16 1750  Gross per 24 hour  Intake             1400 ml  Output               55 ml  Net             1345 ml    General: In visible discomfort Abdomen: soft, non-distended, no superficial tenderness at incision sites which are D/C/I but pain in BLQ Extremities: no edema  Labs: No results found for this or any previous visit (from the past 24 hour(s)).  Cultures: No results found for this or any previous visit.  Imaging:  Assessment   41 y.o. POD0 lap BTL with uncontrolled postoperative pain  Plan   - admit for pain control overnight, po percocet, IV toradol, prn morphine pushes - prn Zofran and promethazine - If continued pain with percocet administration will look at changing analgesic - anticipate discharge tomorrow AM if improved pain control

## 2016-10-26 ENCOUNTER — Encounter: Payer: Self-pay | Admitting: Obstetrics and Gynecology

## 2016-10-26 DIAGNOSIS — Z302 Encounter for sterilization: Secondary | ICD-10-CM | POA: Diagnosis not present

## 2016-10-26 LAB — BASIC METABOLIC PANEL
ANION GAP: 4 — AB (ref 5–15)
BUN: 8 mg/dL (ref 6–20)
CHLORIDE: 107 mmol/L (ref 101–111)
CO2: 26 mmol/L (ref 22–32)
Calcium: 8.3 mg/dL — ABNORMAL LOW (ref 8.9–10.3)
Creatinine, Ser: 0.5 mg/dL (ref 0.44–1.00)
GFR calc non Af Amer: >60 mL/min (ref >60)
Glucose, Bld: 134 mg/dL — ABNORMAL HIGH (ref 65–99)
Potassium: 3.6 mmol/L (ref 3.5–5.1)
SODIUM: 137 mmol/L (ref 135–145)

## 2016-10-26 LAB — CBC
HCT: 32.9 % — ABNORMAL LOW (ref 35.0–47.0)
HEMOGLOBIN: 11.8 g/dL — AB (ref 12.0–16.0)
MCH: 33.4 pg (ref 26.0–34.0)
MCHC: 35.9 g/dL (ref 32.0–36.0)
MCV: 93.1 fL (ref 80.0–100.0)
Platelets: 201 10*3/uL (ref 150–440)
RBC: 3.53 MIL/uL — AB (ref 3.80–5.20)
RDW: 12.6 % (ref 11.5–14.5)
WBC: 7.8 10*3/uL (ref 3.6–11.0)

## 2016-10-26 LAB — POCT PREGNANCY, URINE: Preg Test, Ur: NEGATIVE

## 2016-10-26 MED ORDER — OXYCODONE-ACETAMINOPHEN 5-325 MG PO TABS: 1.0000 | ORAL_TABLET | Freq: Four times a day (QID) | ORAL | 0 refills | Status: DC | PRN

## 2016-10-26 MED ORDER — IBUPROFEN 600 MG PO TABS: 600.0000 mg | ORAL_TABLET | Freq: Four times a day (QID) | ORAL | 0 refills | Status: DC | PRN

## 2016-10-26 NOTE — Discharge Summary (Signed)
Physician Discharge Summary  Patient ID: Haley Moore MRN: [id number] DOB/AGE: [birth year] 41 y.o.  Admit date: 10/25/2016 Discharge date: 10/26/2016  Admission Diagnoses:  Discharge Diagnoses:  Active Problems:   Postoperative pain   Discharged Condition: stable  Hospital Course: Patient admitted for scheduled outpatient laparoscopic tubal ligation.  Patient with significant postoperative pain requiring overnight admission for pain management.  Significant improvement by morning of POD1.  Patient remained hemodynamically stable, afebrile throughout admission.  Was meeting all other postoperative goals by discharge.  Consults: None  Significant Diagnostic Studies:  1) Laparoscopic BTL 10/25/16  Results for orders placed or performed during the hospital encounter of 10/25/16 (from the past 24 hour(s))  CBC     Status: Abnormal   Collection Time: 10/26/16  5:46 AM  Result Value Ref Range   WBC 7.8 3.6 - 11.0 K/uL   RBC 3.53 (L) 3.80 - 5.20 MIL/uL   Hemoglobin 11.8 (L) 12.0 - 16.0 g/dL   HCT 32.9 (L) 35.0 - 47.0 %   MCV 93.1 80.0 - 100.0 fL   MCH 33.4 26.0 - 34.0 pg   MCHC 35.9 32.0 - 36.0 g/dL   RDW 12.6 11.5 - 14.5 %   Platelets 201 150 - 440 K/uL  Basic metabolic panel     Status: Abnormal   Collection Time: 10/26/16  5:46 AM  Result Value Ref Range   Sodium 137 135 - 145 mmol/L   Potassium 3.6 3.5 - 5.1 mmol/L   Chloride 107 101 - 111 mmol/L   CO2 26 22 - 32 mmol/L   Glucose, Bld 134 (H) 65 - 99 mg/dL   BUN 8 6 - 20 mg/dL   Creatinine, Ser 0.50 0.44 - 1.00 mg/dL   Calcium 8.3 (L) 8.9 - 10.3 mg/dL   GFR calc non Af Amer >60 >60 mL/min   GFR calc Af Amer >60 >60 mL/min   Anion gap 4 (L) 5 - 15   Treatments: IV and po analgesics  Discharge Exam: Blood pressure (!) 106/58, pulse 75, temperature 98.2 F (36.8 C), temperature source Oral, resp. rate 18, height 5\' 4"  (1.626 m), weight 175 lb (79.4 kg), last menstrual period 10/22/2016, SpO2 99 %. General  appearance: alert, appears stated age and no distress GI: soft, non-tender; bowel sounds normal; no masses,  no organomegaly Extremities: extremities normal, atraumatic, no cyanosis or edema Trocar sites D/C/I  Disposition: Final discharge disposition not confirmed  Discharge Instructions    Call MD for:    Complete by:  As directed    Heavy vaginal bleeding greater than 1 pad an hour   Call MD for:    Complete by:  As directed    Heavy vaginal bleeding greater than 1 pad an hour   Call MD for:  difficulty breathing, headache or visual disturbances    Complete by:  As directed    Call MD for:  difficulty breathing, headache or visual disturbances    Complete by:  As directed    Call MD for:  extreme fatigue    Complete by:  As directed    Call MD for:  extreme fatigue    Complete by:  As directed    Call MD for:  hives    Complete by:  As directed    Call MD for:  hives    Complete by:  As directed    Call MD for:  persistant dizziness or light-headedness    Complete by:  As directed    Call MD  for:  persistant dizziness or light-headedness    Complete by:  As directed    Call MD for:  persistant nausea and vomiting    Complete by:  As directed    Call MD for:  persistant nausea and vomiting    Complete by:  As directed    Call MD for:  redness, tenderness, or signs of infection (pain, swelling, redness, odor or green/yellow discharge around incision site)    Complete by:  As directed    Call MD for:  redness, tenderness, or signs of infection (pain, swelling, redness, odor or green/yellow discharge around incision site)    Complete by:  As directed    Call MD for:  severe uncontrolled pain    Complete by:  As directed    Call MD for:  severe uncontrolled pain    Complete by:  As directed    Call MD for:  temperature >100.4    Complete by:  As directed    Call MD for:  temperature >100.4    Complete by:  As directed    Diet general    Complete by:  As directed    Diet  general    Complete by:  As directed    Discharge wound care:    Complete by:  As directed    You may apply a light dressing for minor discharge from the incision or to keep waistbands of clothing from rubbing.  You may also have been discharge with a clear dressing in which case this will be removed at your postoperative clinic visit.  You may shower, use soap on your incision.  Avoiding baths or soaking the incision in the first 6 weeks following your surgery.Marland Kitchen   Discharge wound care:    Complete by:  As directed    You may apply a light dressing for minor discharge from the incision or to keep waistbands of clothing from rubbing.  You may also have been discharge with a clear dressing in which case this will be removed at your postoperative clinic visit.  You may shower, use soap on your incision.  Avoiding baths or soaking the incision in the first 6 weeks following your surgery..   Driving restriction    Complete by:  As directed    Avoid driving for at least 2 weeks or while taking prescription narcotics.   Driving restriction    Complete by:  As directed    Avoid driving for at least 2 weeks or while taking prescription narcotics.   Sexual acrtivity    Complete by:  As directed    No intercourse, tampons, or anything vaginally for 6 weeks   Sexual acrtivity    Complete by:  As directed    No intercourse, tampons, or anything vaginally for 6 weeks     Allergies as of 10/26/2016      Reactions   Shellfish Allergy       Medication List    TAKE these medications   cetirizine 10 MG tablet Commonly known as:  ZYRTEC Take 10 mg by mouth as needed for allergies.   COLLAGEN PO Take 1 tablet by mouth daily.   EPIPEN 2-PAK 0.3 mg/0.3 mL Soaj injection Generic drug:  EPINEPHrine   fluticasone 50 MCG/ACT nasal spray Commonly known as:  FLONASE Place 2 sprays into both nostrils daily.   ibuprofen 600 MG tablet Commonly known as:  ADVIL,MOTRIN Take 1 tablet (600 mg total) by  mouth every 6 (six) hours as needed.  What changed:  how much to take  how to take this  when to take this  reasons to take this   multivitamin tablet Take 1 tablet by mouth daily.   oxyCODONE-acetaminophen 5-325 MG tablet Commonly known as:  PERCOCET/ROXICET Take 1-2 tablets by mouth every 6 (six) hours as needed.   sertraline 100 MG tablet Commonly known as:  ZOLOFT TAKE 1 AND 1/2 TABLETS BY MOUTH DAILY *INS REQUIRES 90-DAY SUPPLY* What changed:  See the new instructions.   SUMAtriptan 50 MG tablet Commonly known as:  IMITREX TAKE ONE (1) TABLET (50MG  TOTAL) BY MOUTH ONCE; MAY REPEAT IN 2 HOURSIF HEADACHE NOT IMPROVED            Discharge Care Instructions        Start     Ordered   10/26/16 0000  Call MD for:    Comments:  Heavy vaginal bleeding greater than 1 pad an hour   10/26/16 0742   10/26/16 0000  Call MD for:  temperature >100.4     10/26/16 0742   10/26/16 0000  Call MD for:  persistant nausea and vomiting     10/26/16 0742   10/26/16 0000  Call MD for:  severe uncontrolled pain     10/26/16 0742   10/26/16 0000  Call MD for:  redness, tenderness, or signs of infection (pain, swelling, redness, odor or green/yellow discharge around incision site)     10/26/16 0742   10/26/16 0000  Call MD for:  difficulty breathing, headache or visual disturbances     10/26/16 0742   10/26/16 0000  Call MD for:  hives     10/26/16 0742   10/26/16 0000  Call MD for:  persistant dizziness or light-headedness     10/26/16 0742   10/26/16 0000  Call MD for:  extreme fatigue     10/26/16 0742   10/26/16 0000  Driving restriction    Comments:  Avoid driving for at least 2 weeks or while taking prescription narcotics.   10/26/16 0742   10/26/16 0000  Sexual acrtivity    Comments:  No intercourse, tampons, or anything vaginally for 6 weeks   10/26/16 0742   10/26/16 0000  Discharge wound care:    Comments:  You may apply a light dressing for minor discharge from the  incision or to keep waistbands of clothing from rubbing.  You may also have been discharge with a clear dressing in which case this will be removed at your postoperative clinic visit.  You may shower, use soap on your incision.  Avoiding baths or soaking the incision in the first 6 weeks following your surgery.Marland Kitchen   10/26/16 0742   10/26/16 0000  Diet general     10/26/16 0742   10/25/16 0000  ibuprofen (ADVIL,MOTRIN) 600 MG tablet  Every 6 hours PRN     10/25/16 1435   10/25/16 0000  Call MD for:    Comments:  Heavy vaginal bleeding greater than 1 pad an hour   10/25/16 1435   10/25/16 0000  Call MD for:  temperature >100.4     10/25/16 1435   10/25/16 0000  Call MD for:  persistant nausea and vomiting     10/25/16 1435   10/25/16 0000  Call MD for:  severe uncontrolled pain     10/25/16 1435   10/25/16 0000  Call MD for:  redness, tenderness, or signs of infection (pain, swelling, redness, odor or green/yellow discharge around incision site)  10/25/16 1435   10/25/16 0000  Call MD for:  difficulty breathing, headache or visual disturbances     10/25/16 1435   10/25/16 0000  Call MD for:  hives     10/25/16 1435   10/25/16 0000  Call MD for:  persistant dizziness or light-headedness     10/25/16 1435   10/25/16 0000  Call MD for:  extreme fatigue     10/25/16 1435   10/25/16 0000  Driving restriction    Comments:  Avoid driving for at least 2 weeks or while taking prescription narcotics.   10/25/16 1435   10/25/16 0000  Sexual acrtivity    Comments:  No intercourse, tampons, or anything vaginally for 6 weeks   10/25/16 1435   10/25/16 0000  Discharge wound care:    Comments:  You may apply a light dressing for minor discharge from the incision or to keep waistbands of clothing from rubbing.  You may also have been discharge with a clear dressing in which case this will be removed at your postoperative clinic visit.  You may shower, use soap on your incision.  Avoiding baths or  soaking the incision in the first 6 weeks following your surgery.Marland Kitchen   10/25/16 1435   10/25/16 0000  Diet general     10/25/16 1435   10/25/16 0000  oxyCODONE-acetaminophen (PERCOCET/ROXICET) 5-325 MG tablet  Every 6 hours PRN     10/25/16 1435     Follow-up Information    Malachy Mood, MD Follow up on 11/02/2016.   Specialty:  Obstetrics and Gynecology Why:  For wound re-check , at 10 :[address] Contact information: [address] [place] [address] [phone]           Signed: Malachy Mood 10/26/2016, 7:42 AM

## 2016-10-26 NOTE — Progress Notes (Signed)
Patient discharged home. Discharge instructions, prescriptions and follow up appointment given to and reviewed with patient. Patient verbalized understanding. Escorted out via wheelchair by auxiliary.

## 2016-10-26 NOTE — Anesthesia Postprocedure Evaluation (Signed)
Anesthesia Post Note  Patient: Haley Moore  Procedure(s) Performed: Procedure(s) (LRB): LAPAROSCOPIC TUBAL LIGATION (N/A)  Patient location during evaluation: PACU Anesthesia Type: General Level of consciousness: awake Pain management: pain level controlled Vital Signs Assessment: post-procedure vital signs reviewed and stable Respiratory status: spontaneous breathing Cardiovascular status: stable Anesthetic complications: no     Last Vitals:  Vitals:   10/25/16 2328 10/26/16 0454  BP: (!) 101/59 (!) 106/58  Pulse: 73 75  Resp: 18 18  Temp: 36.9 C 36.8 C  SpO2: 98% 99%    Last Pain:  Vitals:   10/26/16 0618  TempSrc:   PainSc: Asleep                 VAN STAVEREN,Marget Outten

## 2016-11-02 ENCOUNTER — Ambulatory Visit (INDEPENDENT_AMBULATORY_CARE_PROVIDER_SITE_OTHER): Payer: 59 | Admitting: Obstetrics and Gynecology

## 2016-11-02 ENCOUNTER — Encounter: Payer: Self-pay | Admitting: Obstetrics and Gynecology

## 2016-11-02 VITALS — BP 104/62 | HR 79 | Wt 172.0 lb

## 2016-11-02 DIAGNOSIS — Z09 Encounter for follow-up examination after completed treatment for conditions other than malignant neoplasm: Secondary | ICD-10-CM

## 2016-11-02 DIAGNOSIS — G44229 Chronic tension-type headache, not intractable: Secondary | ICD-10-CM | POA: Diagnosis not present

## 2016-11-02 DIAGNOSIS — M9901 Segmental and somatic dysfunction of cervical region: Secondary | ICD-10-CM | POA: Diagnosis not present

## 2016-11-02 DIAGNOSIS — M542 Cervicalgia: Secondary | ICD-10-CM | POA: Diagnosis not present

## 2016-11-03 NOTE — Progress Notes (Signed)
      Postoperative Follow-up Patient presents post op from laparoscopic BTL 1weeks ago for requested sterilization.  Subjective: Patient reports marked improvement in her preop symptoms. Eating a regular diet without difficulty. The patient is not having any pain.  Activity: normal activities of daily living.  Objective: Vitals:   11/02/16 1052  BP: 104/62  Pulse: 79   Gen: NAD Pulmonary: no increased work of breathing Abdomen: soft, non-tender, non-distended, incision D/C/I Ext: no edema  Assessment: 41 y.o. s/p laparoscopic BTL stable  Plan: Patient has done well after surgery with no apparent complications.  I have discussed the post-operative course to date, and the expected progress moving forward.  The patient understands what complications to be concerned about.  I will see the patient in routine follow up, or sooner if needed.    Activity plan: No restriction.  Malachy Mood 11/03/2016, 9:47 AM

## 2016-11-11 DIAGNOSIS — M542 Cervicalgia: Secondary | ICD-10-CM | POA: Diagnosis not present

## 2016-11-11 DIAGNOSIS — G44229 Chronic tension-type headache, not intractable: Secondary | ICD-10-CM | POA: Diagnosis not present

## 2016-11-11 DIAGNOSIS — M9901 Segmental and somatic dysfunction of cervical region: Secondary | ICD-10-CM | POA: Diagnosis not present

## 2016-11-24 ENCOUNTER — Ambulatory Visit (INDEPENDENT_AMBULATORY_CARE_PROVIDER_SITE_OTHER): Payer: 59 | Admitting: Obstetrics and Gynecology

## 2016-11-24 ENCOUNTER — Encounter: Payer: Self-pay | Admitting: Obstetrics and Gynecology

## 2016-11-24 VITALS — BP 100/60 | HR 82 | Ht 64.0 in | Wt 168.0 lb

## 2016-11-24 DIAGNOSIS — N760 Acute vaginitis: Secondary | ICD-10-CM

## 2016-11-24 LAB — POCT WET PREP WITH KOH
Clue Cells Wet Prep HPF POC: NEGATIVE
KOH PREP POC: NEGATIVE
TRICHOMONAS UA: NEGATIVE
YEAST WET PREP PER HPF POC: NEGATIVE

## 2016-11-24 NOTE — Progress Notes (Signed)
Chief Complaint  Patient presents with  . Vaginitis    HPI:      [name] is a 41 y.o. G0P0000 who LMP was Patient's last menstrual period was 10/21/2016., presents today for vaginal irritation without increased d/c, odor for the past wk. No new soaps/detergents/no dryer sheets. No recent abx use. She is due for her period and notes cramping and LBP. No urin sx. Does have some constipation with hard stools recently. No diarrhea. She was treated for vaginitis 4/18 with lotrisone crm with sx relief. Pt has been using a little of that for 2-3 days with no real sx change. She is s/p TL 9/18 and just wants to make sure she's ok.  No new sex partners.    Past Medical History:  Diagnosis Date  . Allergy   . Anxiety   . Depression   . Dysmenorrhea   . Malignant melanoma of skin of chest ([facility])    2018  . Menorrhagia   . Migraine     Past Surgical History:  Procedure Laterality Date  . BREAST ENHANCEMENT SURGERY    . LAPAROSCOPIC TUBAL LIGATION N/A 10/25/2016   Procedure: LAPAROSCOPIC TUBAL LIGATION;  Surgeon: Malachy Mood, MD;  Location: [facility] ORS;  Service: Gynecology;  Laterality: N/A;  . SKIN CANCER EXCISION    . WRIST SURGERY      Family History  Problem Relation Age of Onset  . Heart disease Mother   . Uterine cancer Maternal Grandmother 50       vs ovar ca    Social History   Social History  . Marital status: Married    Spouse name: N/A  . Number of children: 0  . Years of education: N/A   Occupational History  . Not on file.   Social History Main Topics  . Smoking status: Former Smoker    Types: Cigarettes    Quit date: 02/07/1998  . Smokeless tobacco: Never Used     Comment: 1 PACK WEEKLY  . Alcohol use 0.0 oz/week     Comment: RARE  . Drug use: No  . Sexual activity: Yes    Birth control/ protection: None   Other Topics Concern  . Not on file   Social History Narrative   ** Merged History Encounter **         Current Outpatient  Prescriptions:  .  EPIPEN 2-PAK 0.3 MG/0.3ML SOAJ injection, , Disp: , Rfl: 0 .  Multiple Vitamin (MULTIVITAMIN) tablet, Take 1 tablet by mouth daily., Disp: , Rfl:  .  sertraline (ZOLOFT) 100 MG tablet, TAKE 1 AND 1/2 TABLETS BY MOUTH DAILY *INS REQUIRES 90-DAY SUPPLY* (Patient taking differently: TAKE 1 TABLET DAILY-BEDTIME), Disp: 135 tablet, Rfl: 1 .  cetirizine (ZYRTEC) 10 MG tablet, Take 10 mg by mouth as needed for allergies., Disp: , Rfl:  .  COLLAGEN PO, Take 1 tablet by mouth daily., Disp: , Rfl:  .  ibuprofen (ADVIL,MOTRIN) 600 MG tablet, TAKE 1 TABLET BY MOUTH EVERY 6 HOURS IF NEEDED, Disp: , Rfl: 0 .  oxyCODONE-acetaminophen (PERCOCET/ROXICET) 5-325 MG tablet, TAKE 1 TO 2 TABLETS BY MOUTH EVERY 6 HOURS IF NEEDED, Disp: , Rfl: 0   ROS:  Review of Systems  Constitutional: Negative for fever.  Gastrointestinal: Negative for blood in stool, constipation, diarrhea, nausea and vomiting.  Genitourinary: Negative for dyspareunia, dysuria, flank pain, frequency, hematuria, urgency, vaginal bleeding, vaginal discharge and vaginal pain.  Musculoskeletal: Negative for back pain.  Skin: Negative for rash.  OBJECTIVE:   Vitals:  BP 100/60   Pulse 82   Ht 5\' 4"  (1.626 m)   Wt 168 lb (76.2 kg)   LMP 10/21/2016   BMI 28.84 kg/m   Physical Exam  Constitutional: She is oriented to person, place, and time and well-developed, well-nourished, and in no distress. Vital signs are normal.  Genitourinary: Vagina normal, uterus normal, cervix normal, right adnexa normal, left adnexa normal and vulva normal. Uterus is not enlarged. Cervix exhibits no motion tenderness and no tenderness. Right adnexum displays no mass and no tenderness. Left adnexum displays no mass and no tenderness. Vulva exhibits no erythema, no exudate, no lesion, no rash and no tenderness. Vagina exhibits no lesion.  Neurological: She is oriented to person, place, and time.  Vitals reviewed.   Results: Results for  orders placed or performed in visit on 11/24/16 (from the past 24 hour(s))  POCT Wet Prep with KOH     Status: Normal   Collection Time: 11/24/16 11:30 AM  Result Value Ref Range   Trichomonas, UA Negative    Clue Cells Wet Prep HPF POC neg    Epithelial Wet Prep HPF POC  Few, Moderate, Many, Too numerous to count   Yeast Wet Prep HPF POC neg    Bacteria Wet Prep HPF POC  Few   RBC Wet Prep HPF POC     WBC Wet Prep HPF POC     KOH Prep POC Negative Negative     Assessment/Plan: Acute vaginitis - Neg exam/wet prep. Cont lotrisone crm--has plenty of Rx left. F/u prn. Reassurance. - Plan: POCT Wet Prep with KOH   Return if symptoms worsen or fail to improve.  Alicia B. Copland, PA-C 11/24/2016 11:31 AM

## 2016-12-13 ENCOUNTER — Encounter: Payer: Self-pay | Admitting: Family Medicine

## 2016-12-13 ENCOUNTER — Ambulatory Visit (INDEPENDENT_AMBULATORY_CARE_PROVIDER_SITE_OTHER): Payer: 59 | Admitting: Family Medicine

## 2016-12-13 VITALS — BP 112/74 | HR 79 | Temp 98.7°F | Resp 16 | Ht 64.0 in | Wt 167.0 lb

## 2016-12-13 DIAGNOSIS — F329 Major depressive disorder, single episode, unspecified: Secondary | ICD-10-CM

## 2016-12-13 DIAGNOSIS — F419 Anxiety disorder, unspecified: Secondary | ICD-10-CM

## 2016-12-13 DIAGNOSIS — Z0000 Encounter for general adult medical examination without abnormal findings: Secondary | ICD-10-CM

## 2016-12-13 NOTE — Progress Notes (Signed)
Subjective:     Patient ID: Haley Moore, female   DOB: [birth year], 41 y.o.   MRN: [id number]  HPI  Chief Complaint  Patient presents with  . Annual Exam    Patient comes in office today for her annual physical she states that she feels well today and has no questions or conerns. Patient reports last pap exam was at [facility] in 11/2016, she states results were normal. Mammogram was ordered on 08/17/16, patient is still due to have exam. Patient has declined flu vaccine and Tdap vaccine today. Patient averages 3-8hrs of sleep a night and maintaining a balanced diet, patient reports exercising 6x a week at gym.   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 States emotionally she feels quite well and has tapered back sertraline to 50 mg in the last few days She is completing a divorce and is no longer on oral contraceptives all of which she believes has improved her mental health. 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   Review of Systems  Respiratory: Negative for shortness of breath.   Cardiovascular: Negative for chest pain and palpitations.       Objective:   Physical Exam  Constitutional: She appears well-developed and well-nourished. No distress.  Psychiatric: She has a normal mood and affect. Her behavior is normal.  Eyes: PERRLA Ears: TM's intact without inflammation Mouth: No tonsillar enlargement, erythema or exudate. No TMJ tenderness or crepitus. Neck: supple with  FROM and no cervical adenopathy, thyromegaly, tenderness or nodules Lungs: clear Heart: RRR without murmur; blood pressure and pulse improved after exercise.  Abd: soft, non-tender, no organomegaly GU: no hernia Neuro: oriented x 3; recalls  an address after 5 minutes; finger to nose, tandem walk and heel to toe WNL Extremities: Muscle strength 5/5 in upper and lower extremities. Shoulders, elbows, and wrists  with FROM.      Assessment:    1. Anxiety and depression: stable   2. Annual physical exam; form completed for professional wrestling    Plan:   Approved for sports participation. Will return after the holidays for consideration of further taper of sertraline.

## 2016-12-13 NOTE — Patient Instructions (Signed)
Return in January to discuss tapering of medication.

## 2017-01-17 ENCOUNTER — Ambulatory Visit (INDEPENDENT_AMBULATORY_CARE_PROVIDER_SITE_OTHER): Payer: 59 | Admitting: Obstetrics and Gynecology

## 2017-01-17 ENCOUNTER — Encounter: Payer: Self-pay | Admitting: Obstetrics and Gynecology

## 2017-01-17 VITALS — BP 120/80 | HR 78 | Ht 64.0 in | Wt 168.0 lb

## 2017-01-17 DIAGNOSIS — F3281 Premenstrual dysphoric disorder: Secondary | ICD-10-CM | POA: Insufficient documentation

## 2017-01-17 DIAGNOSIS — N926 Irregular menstruation, unspecified: Secondary | ICD-10-CM | POA: Diagnosis not present

## 2017-01-17 DIAGNOSIS — N921 Excessive and frequent menstruation with irregular cycle: Secondary | ICD-10-CM

## 2017-01-17 MED ORDER — TRANEXAMIC ACID 650 MG PO TABS: 1300.0000 mg | ORAL_TABLET | Freq: Three times a day (TID) | ORAL | 8 refills | Status: DC

## 2017-01-17 MED ORDER — CLONAZEPAM 0.5 MG PO TABS: 0.5000 mg | ORAL_TABLET | Freq: Two times a day (BID) | ORAL | 0 refills | Status: DC | PRN

## 2017-01-17 NOTE — Progress Notes (Signed)
Chief Complaint  Patient presents with  . Menstrual Problem    HPI:      [name] is a 41 y.o. G0P0000 who LMP was Patient's last menstrual period was 01/17/2017., presents today for irregular menses 10/18 and severe PMDD and cramps.  Pt had period 9/18, 12/05/16, about 12/18/16, and then 01/17/17. Pt s/p TL 9/18.  Pt with long hx of PMDD and menometrorrhagia. Pt has failed multiple OCPs and IUD for sx. She had IUD removed 4/18 with sx improvement of PMDD sx, but they recurred this menstrual cycle. Pt had bloating, irritability and significant rage last wk (1 wk before her period started). Pt started her period today and rage has improved.  She has been having bloating and severe dysmen since last wk as well. Taking ibup 600 mg and using heating pad without relief, couldn't get out of bed. Has nausea with the pain. Took leftover oxycodone with sx improvement.  Pt also has heavy flow, soiling her bed and pjs at times. She states she can tolerate heavy flow but not the rage or pain.  She is s/p TL 9/18. Dr. Georgianne Fick suggested endometrial ablation but pt declined at that time. Has never tried lysteda.  She is already on sertraline 100 mg daily for anxiety/depression sx with sx control. She was given klonopin in the past by her PCP for PMDD sx and it helped a lot. #30 pills last her a whole yr. She would like to try it again.   Past Medical History:  Diagnosis Date  . Allergy   . Anxiety   . Depression   . Dysmenorrhea   . Malignant melanoma of skin of chest ([place])    2018  . Menorrhagia   . Migraine     Past Surgical History:  Procedure Laterality Date  . BREAST ENHANCEMENT SURGERY    . LAPAROSCOPIC TUBAL LIGATION N/A 10/25/2016   Procedure: LAPAROSCOPIC TUBAL LIGATION;  Surgeon: Malachy Mood, MD;  Location: [facility] ORS;  Service: Gynecology;  Laterality: N/A;  . SKIN CANCER EXCISION    . WRIST SURGERY      Family History  Problem Relation Age of Onset  . Heart  disease Mother   . Uterine cancer Maternal Grandmother 50       vs ovar ca    Social History   Socioeconomic History  . Marital status: Married    Spouse name: Not on file  . Number of children: 0  . Years of education: Not on file  . Highest education level: Not on file  Social Needs  . Financial resource strain: Not on file  . Food insecurity - worry: Not on file  . Food insecurity - inability: Not on file  . Transportation needs - medical: Not on file  . Transportation needs - non-medical: Not on file  Occupational History  . Not on file  Tobacco Use  . Smoking status: Former Smoker    Types: Cigarettes    Last attempt to quit: 02/07/1998    Years since quitting: 18.9  . Smokeless tobacco: Never Used  . Tobacco comment: 1 PACK WEEKLY  Substance and Sexual Activity  . Alcohol use: Yes    Alcohol/week: 0.0 oz    Comment: RARE  . Drug use: No  . Sexual activity: Yes    Birth control/protection: None, Surgical  Other Topics Concern  . Not on file  Social History Narrative   ** Merged History Encounter **  Current Outpatient Medications:  .  cetirizine (ZYRTEC) 10 MG tablet, Take 10 mg by mouth as needed for allergies., Disp: , Rfl:  .  clonazePAM (KLONOPIN) 0.5 MG tablet, Take 1 tablet (0.5 mg total) by mouth 2 (two) times daily as needed for anxiety., Disp: 30 tablet, Rfl: 0 .  COLLAGEN PO, Take 1 tablet by mouth daily., Disp: , Rfl:  .  EPIPEN 2-PAK 0.3 MG/0.3ML SOAJ injection, , Disp: , Rfl: 0 .  ibuprofen (ADVIL,MOTRIN) 600 MG tablet, TAKE 1 TABLET BY MOUTH EVERY 6 HOURS IF NEEDED, Disp: , Rfl: 0 .  Multiple Vitamin (MULTIVITAMIN) tablet, Take 1 tablet by mouth daily., Disp: , Rfl:  .  oxyCODONE-acetaminophen (PERCOCET/ROXICET) 5-325 MG tablet, TAKE 1 TO 2 TABLETS BY MOUTH EVERY 6 HOURS IF NEEDED, Disp: , Rfl: 0 .  sertraline (ZOLOFT) 100 MG tablet, TAKE 1 AND 1/2 TABLETS BY MOUTH DAILY *INS REQUIRES 90-DAY SUPPLY* (Patient taking differently: TAKE 1 TABLET  DAILY-BEDTIME), Disp: 135 tablet, Rfl: 1 .  tranexamic acid (LYSTEDA) 650 MG TABS tablet, Take 2 tablets (1,300 mg total) by mouth 3 (three) times daily. Take during menses for a maximum of five days, Disp: 30 tablet, Rfl: 8   ROS:  Review of Systems  Constitutional: Negative for fever.  Gastrointestinal: Negative for blood in stool, constipation, diarrhea, nausea and vomiting.  Genitourinary: Positive for menstrual problem. Negative for dyspareunia, dysuria, flank pain, frequency, hematuria, urgency, vaginal bleeding, vaginal discharge and vaginal pain.  Musculoskeletal: Negative for back pain.  Skin: Negative for rash.     OBJECTIVE:   Vitals:  BP 120/80   Pulse 78   Ht 5\' 4"  (1.626 m)   Wt 168 lb (76.2 kg)   LMP 01/17/2017   BMI 28.84 kg/m   Physical Exam  Constitutional: She is oriented to person, place, and time and well-developed, well-nourished, and in no distress.  Neurological: She is alert and oriented to person, place, and time.  Psychiatric: Memory, affect and judgment normal.  Vitals reviewed.   Assessment/Plan: PMDD (premenstrual dysphoric disorder) - Had been improved since 4/18 but recurred this cycle. Increase sertraline to 150 mg luteal phase and Rx klonopin prn rage. Take sparingly. F/u prn sx. - Plan: clonazePAM (KLONOPIN) 0.5 MG tablet  Menometrorrhagia - Failed OCPs, IUD. S/P TL. Rx lysteda. Will check u/s if sx aren't improved with Lysteda. F/u via phone prn sx.  - Plan: tranexamic acid (LYSTEDA) 650 MG TABS tablet  Irregular menses - 10/18, cycle after surg. Sx resolved. Follow expectantly.    Meds ordered this encounter  Medications  . tranexamic acid (LYSTEDA) 650 MG TABS tablet    Sig: Take 2 tablets (1,300 mg total) by mouth 3 (three) times daily. Take during menses for a maximum of five days    Dispense:  30 tablet    Refill:  8  . clonazePAM (KLONOPIN) 0.5 MG tablet    Sig: Take 1 tablet (0.5 mg total) by mouth 2 (two) times daily as  needed for anxiety.    Dispense:  30 tablet    Refill:  0      Return if symptoms worsen or fail to improve.  Alicia B. Copland, PA-C 01/17/2017 3:09 PM

## 2017-01-17 NOTE — Patient Instructions (Signed)
I value your feedback and entrusting us with your care. If you get a [facility] patient survey, I would appreciate you taking the time to let us know about your experience today. Thank you! 

## 2017-05-05 ENCOUNTER — Other Ambulatory Visit: Payer: Self-pay | Admitting: Family Medicine

## 2017-05-05 DIAGNOSIS — F329 Major depressive disorder, single episode, unspecified: Secondary | ICD-10-CM

## 2017-05-05 DIAGNOSIS — F419 Anxiety disorder, unspecified: Principal | ICD-10-CM

## 2017-05-05 DIAGNOSIS — F32A Depression, unspecified: Secondary | ICD-10-CM

## 2017-05-05 NOTE — Telephone Encounter (Signed)
She had intended at last office visit to taper off medication and was down to 50 mg. Left message to call about need for refill and/or current dose.

## 2017-05-11 NOTE — Telephone Encounter (Signed)
Unable to reach patient at home, voicemail box is full and work phone continually rings. KW

## 2017-07-05 ENCOUNTER — Ambulatory Visit (INDEPENDENT_AMBULATORY_CARE_PROVIDER_SITE_OTHER): Payer: 59 | Admitting: Maternal Newborn

## 2017-07-05 VITALS — BP 104/80 | HR 72 | Temp 98.4°F | Ht 64.0 in | Wt 185.0 lb

## 2017-07-05 DIAGNOSIS — R350 Frequency of micturition: Secondary | ICD-10-CM

## 2017-07-05 DIAGNOSIS — F3281 Premenstrual dysphoric disorder: Secondary | ICD-10-CM | POA: Diagnosis not present

## 2017-07-05 DIAGNOSIS — R300 Dysuria: Secondary | ICD-10-CM

## 2017-07-05 DIAGNOSIS — N921 Excessive and frequent menstruation with irregular cycle: Secondary | ICD-10-CM | POA: Diagnosis not present

## 2017-07-05 DIAGNOSIS — N390 Urinary tract infection, site not specified: Secondary | ICD-10-CM

## 2017-07-05 LAB — POCT URINALYSIS DIPSTICK
Bilirubin, UA: NEGATIVE
GLUCOSE UA: NEGATIVE
Ketones, UA: NEGATIVE
Nitrite, UA: NEGATIVE
PH UA: 6.5 (ref 5.0–8.0)
Protein, UA: POSITIVE — AB
Spec Grav, UA: 1.01 (ref 1.010–1.025)
Urobilinogen, UA: NEGATIVE E.U./dL — AB

## 2017-07-05 MED ORDER — CEPHALEXIN 500 MG PO CAPS: 500.0000 mg | ORAL_CAPSULE | Freq: Four times a day (QID) | ORAL | 0 refills | Status: AC

## 2017-07-05 MED ORDER — CLONAZEPAM 0.5 MG PO TABS: 0.5000 mg | ORAL_TABLET | Freq: Two times a day (BID) | ORAL | 0 refills | Status: DC | PRN

## 2017-07-05 MED ORDER — TRANEXAMIC ACID 650 MG PO TABS: 1300.0000 mg | ORAL_TABLET | Freq: Three times a day (TID) | ORAL | 8 refills | Status: DC

## 2017-07-07 LAB — URINE CULTURE

## 2017-07-13 ENCOUNTER — Encounter: Payer: Self-pay | Admitting: Family Medicine

## 2017-07-13 ENCOUNTER — Ambulatory Visit (INDEPENDENT_AMBULATORY_CARE_PROVIDER_SITE_OTHER): Payer: 59 | Admitting: Family Medicine

## 2017-07-13 VITALS — BP 102/66 | HR 73 | Temp 97.8°F | Resp 16 | Wt 187.0 lb

## 2017-07-13 DIAGNOSIS — F3281 Premenstrual dysphoric disorder: Secondary | ICD-10-CM

## 2017-07-13 MED ORDER — SERTRALINE HCL 50 MG PO TABS: ORAL_TABLET | ORAL | 0 refills | Status: DC

## 2017-07-13 MED ORDER — FUROSEMIDE 20 MG PO TABS: 20.0000 mg | ORAL_TABLET | Freq: Every day | ORAL | 3 refills | Status: DC

## 2017-07-13 NOTE — Progress Notes (Signed)
Obstetrics & Gynecology Office Visit   Chief Complaint:  Chief Complaint  Patient presents with  . Urinary Tract Infection    freq. urination c little volume, burns at end of stream, incontinent at times, problems c period.    History of Present Illness: Haley Moore presents today with frequent urination (up to twenty times a day) with small amounts of urine, urgency, and dysuria with burning as she completes voiding. These symptoms have been worsening for a few days. She also has bilateral lower back pain. Herta also reports ongoing mood problems (anxiety and depression, rage) associated with her menses and heavy periods multiple times per month.   Review of Systems: She also reports fatigue, nasal congestion, and environmental allergies. Otherwise, review of systems negative unless noted in HPI.  Past Medical History:  Past Medical History:  Diagnosis Date  . Allergy   . Anxiety   . Depression   . Dysmenorrhea   . Malignant melanoma of skin of chest ([place])    2018  . Menorrhagia   . Migraine     Past Surgical History:  Past Surgical History:  Procedure Laterality Date  . BREAST ENHANCEMENT SURGERY    . LAPAROSCOPIC TUBAL LIGATION N/A 10/25/2016   Procedure: LAPAROSCOPIC TUBAL LIGATION;  Surgeon: Malachy Mood, MD;  Location: [facility] ORS;  Service: Gynecology;  Laterality: N/A;  . SKIN CANCER EXCISION    . WRIST SURGERY      Gynecologic History: Patient's last menstrual period was 06/20/2017.  Obstetric History: G0P0000  Family History:  Family History  Problem Relation Age of Onset  . Heart disease Mother   . Uterine cancer Maternal Grandmother 4       vs ovar ca    Social History:  Social History   Socioeconomic History  . Marital status: Married    Spouse name: Not on file  . Number of children: 0  . Years of education: Not on file  . Highest education level: Not on file  Occupational History  . Not on file  Social Needs  . Financial resource strain: Not on  file  . Food insecurity:    Worry: Not on file    Inability: Not on file  . Transportation needs:    Medical: Not on file    Non-medical: Not on file  Tobacco Use  . Smoking status: Former Smoker    Types: Cigarettes    Last attempt to quit: 02/07/1998    Years since quitting: 19.4  . Smokeless tobacco: Never Used  . Tobacco comment: 1 PACK WEEKLY  Substance and Sexual Activity  . Alcohol use: Yes    Alcohol/week: 0.0 oz    Comment: RARE  . Drug use: No  . Sexual activity: Yes    Birth control/protection: None, Surgical  Lifestyle  . Physical activity:    Days per week: Not on file    Minutes per session: Not on file  . Stress: Not on file  Relationships  . Social connections:    Talks on phone: Not on file    Gets together: Not on file    Attends religious service: Not on file    Active member of club or organization: Not on file    Attends meetings of clubs or organizations: Not on file    Relationship status: Not on file  . Intimate partner violence:    Fear of current or ex partner: Not on file    Emotionally abused: Not on file    Physically  abused: Not on file    Forced sexual activity: Not on file  Other Topics Concern  . Not on file  Social History Narrative   ** Merged History Encounter **        Allergies:  Allergies  Allergen Reactions  . Shellfish Allergy     Medications: Prior to Admission medications   Medication Sig Start Date End Date Taking? Authorizing Provider  cetirizine (ZYRTEC) 10 MG tablet Take 10 mg by mouth as needed for allergies.   Yes [provider]  Cholecalciferol (VITAMIN D-1000 MAX ST) 1000 units tablet Take 1 tablet by mouth daily.   Yes [provider]  clonazePAM (KLONOPIN) 0.5 MG tablet Take 1 tablet (0.5 mg total) by mouth 2 (two) times daily as needed for anxiety. 07/05/17  Yes Rexene Agent, CNM  COLLAGEN PO Take 1 tablet by mouth daily.   Yes [provider]  EPIPEN 2-PAK 0.3 MG/0.3ML SOAJ  injection  08/21/14  Yes [provider]  ibuprofen (ADVIL,MOTRIN) 600 MG tablet TAKE 1 TABLET BY MOUTH EVERY 6 HOURS IF NEEDED 10/30/16  Yes [provider]  Multiple Vitamin (MULTIVITAMIN) tablet Take 1 tablet by mouth daily.   Yes [provider]  omega-3 fish oil (MAXEPA) 1000 MG CAPS capsule Take 1 capsule by mouth daily.   Yes [provider]  sertraline (ZOLOFT) 25 MG tablet Take 1 tablet by mouth daily.   Yes [provider]  tranexamic acid (LYSTEDA) 650 MG TABS tablet Take 2 tablets (1,300 mg total) by mouth 3 (three) times daily. Take during menses for a maximum of five days 07/05/17  Yes Rexene Agent, CNM    Physical Exam Vitals:  Vitals:   07/05/17 1032  BP: 104/80  Pulse: 72  Temp: 98.4 F (36.9 C)   Patient's last menstrual period was 06/20/2017.  General: NAD HEENT: normocephalic, anicteric Pulmonary: No increased work of breathing Genitourinary: deferred Neurologic: Grossly intact Psychiatric: mood appropriate, affect full  Assessment: 42 y.o. G0P0000 with symptoms of a urinary tract infection and ongoing PMDD and abnormal uterine bleeding.  Plan: Problem List Items Addressed This Visit      Other   PMDD (premenstrual dysphoric disorder)   Relevant Medications   clonazePAM (KLONOPIN) 0.5 MG tablet    Other Visit Diagnoses    Urinary frequency    -  Primary   Relevant Orders   POCT urinalysis dipstick (Completed)   Urine Culture (Completed)   Menometrorrhagia       Failed OCPs, IUD. S/P TL. Rx lysteda. Will check u/s if sx aren't improved with Lysteda. F/u via phone prn sx.    Relevant Medications   tranexamic acid (LYSTEDA) 650 MG TABS tablet   Urinary tract infection without hematuria, site unspecified       Dysuria         1) UA with moderate leukocytes. Treat UTI symptoms with Keflex pending urine culture results.  2) Advised appointment with OB to discuss endometrial ablation as she has tried  multiple therapies for heavy bleeding which have failed to control symptoms.  3) Refill Lysteda for now and cloneazepam to help with PMDD symptoms.  Avel Sensor, CNM 07/05/2017

## 2017-07-13 NOTE — Patient Instructions (Addendum)
At onset of migraine headache take two Aleve or Excedrin Migraine. Let me know if you aren't tolerating the medication before your next visit.

## 2017-07-13 NOTE — Progress Notes (Signed)
  Subjective:     Patient ID: Haley Moore, female   DOB: [birth year], 42 y.o.   MRN: [id number] Chief Complaint  Patient presents with  . Premenstrual Syndrome   HPI Has seen her gyn provider for this long standing problem. Has been offered endometrial ablation but has deferred. Concerned mainly about being a "crazy mess" with uncontrollable crying for the week or so prior to her menses. Also reports bloating, weight gain and migraine headaches. Has been trying 25 to 37.5 mg.of sertraline and clonazepam prn with little improvement. 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 Her divorce is near finalization. States she is fairly happy when she is not pre-menstrual as she is today.  Review of Systems     Objective:   Physical Exam  Constitutional: She appears well-developed and well-nourished. No distress.  Cardiovascular: Normal rate and regular rhythm.  Pulmonary/Chest: Breath sounds normal.  Musculoskeletal: She exhibits no edema (PEDAL).       Assessment:    1. PMDD (premenstrual dysphoric disorder): furosemide prn and increased sertraline to 50 mg. Daily.     Plan:    Discussed use of Excedrin Migraine or two Aleve at onset of headaches pending f/u in two weeks.

## 2017-07-16 ENCOUNTER — Encounter: Payer: Self-pay | Admitting: Maternal Newborn

## 2017-07-27 ENCOUNTER — Ambulatory Visit: Payer: Self-pay | Admitting: Family Medicine

## 2017-07-31 ENCOUNTER — Encounter: Payer: Self-pay | Admitting: Family Medicine

## 2017-07-31 ENCOUNTER — Ambulatory Visit (INDEPENDENT_AMBULATORY_CARE_PROVIDER_SITE_OTHER): Payer: 59 | Admitting: Family Medicine

## 2017-07-31 VITALS — BP 96/70 | HR 63 | Temp 98.8°F | Resp 15 | Wt 188.2 lb

## 2017-07-31 DIAGNOSIS — F3281 Premenstrual dysphoric disorder: Secondary | ICD-10-CM

## 2017-07-31 MED ORDER — SERTRALINE HCL 50 MG PO TABS: ORAL_TABLET | ORAL | 1 refills | Status: DC

## 2017-07-31 MED ORDER — FUROSEMIDE 20 MG PO TABS: ORAL_TABLET | ORAL | 1 refills | Status: DC

## 2017-07-31 NOTE — Patient Instructions (Signed)
Phone followup in two to three weeks

## 2017-07-31 NOTE — Progress Notes (Signed)
  Subjective:     Patient ID: Haley Moore, female   DOB: 30-Jun-1975, 42 y.o.   MRN: [id number] Chief Complaint  Patient presents with  . PMDD    Patient returns to office today for follow up visit , last visit was 07/13/17 we increased Sertraline to 50mg  qd and encouraged Furosemide PRN. Patient reports good symptom control and compliance on medication.    HPI Wishes to stay on medication. Some confusion about prior prescription for 150 mg. Daily and she has been taking some of that as well. Also needs instructions clarified on prior prescribed medication for furosemide and sertraline prescribed by myself.  Review of Systems     Objective:   Physical Exam  Psychiatric: She has a normal mood and affect. Her behavior is normal.       Assessment:    1. PMDD (premenstrual dysphoric disorder): continue sertraline at 50 mg. And furosemide as needed.    Plan:    Phone follow-up in 2-3 weeks.

## 2017-08-08 ENCOUNTER — Telehealth: Payer: Self-pay

## 2017-08-08 ENCOUNTER — Other Ambulatory Visit: Payer: Self-pay | Admitting: Obstetrics & Gynecology

## 2017-08-08 NOTE — Telephone Encounter (Signed)
Pt will call insurance and find out if covered. Pt does not want to come in for consult she already knows she wants to do this, She has discussed it with [facility]. She does not have the copay for consult.

## 2017-08-08 NOTE — Telephone Encounter (Signed)
[employer] is calling to find out the CPT is for Ablation. To help patient with how much would be covered. Please advise

## 2017-08-08 NOTE — Telephone Encounter (Signed)
This pt called triage line, wanting ablation. She needs the dx code to give to her insurance company, to see if they will cover it. Does she need to come in for a consult first or can you give her a code for her insurance? Please advise

## 2017-08-08 NOTE — Telephone Encounter (Signed)
Menometrorrhagia N92.1 is the dx/ code Can sch appt to discuss and schedule when she is ready

## 2017-08-09 NOTE — Telephone Encounter (Signed)
Patient returned the call, CPT [phone] was given, along w/ the patient's insurance info on the [facility] site. Due to the patient's $5000 deductible, she has decided to hold off on the ablation for now. Patient is expecting to change insurance policies soon, and will call back when she is able to have the ablation.

## 2017-08-09 NOTE — Telephone Encounter (Signed)
See above for ICD-10 Can u also give CPT code for endometrial ablation with hysteroscopy plz? Thx

## 2017-08-09 NOTE — Telephone Encounter (Signed)
Lmtrc

## 2017-09-15 ENCOUNTER — Encounter: Payer: Self-pay | Admitting: Family Medicine

## 2017-09-15 ENCOUNTER — Ambulatory Visit (INDEPENDENT_AMBULATORY_CARE_PROVIDER_SITE_OTHER): Payer: 59 | Admitting: Family Medicine

## 2017-09-15 VITALS — BP 100/70 | HR 75 | Temp 98.5°F | Resp 16 | Wt 191.8 lb

## 2017-09-15 DIAGNOSIS — J039 Acute tonsillitis, unspecified: Secondary | ICD-10-CM

## 2017-09-15 DIAGNOSIS — F3281 Premenstrual dysphoric disorder: Secondary | ICD-10-CM | POA: Diagnosis not present

## 2017-09-15 LAB — POCT RAPID STREP A (OFFICE): Rapid Strep A Screen: POSITIVE — AB

## 2017-09-15 MED ORDER — AMOXICILLIN 875 MG PO TABS
875.0000 mg | ORAL_TABLET | Freq: Two times a day (BID) | ORAL | 0 refills | Status: DC
Start: 2017-09-15 — End: 2017-10-20

## 2017-09-15 NOTE — Patient Instructions (Signed)
Go up on the sertraline to 100 mg.once daily for two weeks then call and let me know how you are doing. Wait until you are tolerating the amoxicillin to start.

## 2017-09-15 NOTE — Progress Notes (Signed)
  Subjective:     Patient ID: Haley Moore, female   DOB: 03-02-1975, 42 y.o.   MRN: [id number] Chief Complaint  Patient presents with  . Sore Throat    Patient comes in office today with concerns of sore throat for the past 4 days. Patient reports pain when swallowing, cough in PM, post nasal drip, sinus pain and ears being stopped up. Patient has taken otc Nyquil, Mucinex and Ibuprofen   HPI Reports she developed fatigue and chills with the sx above. No fever documented. 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 Also states she is feeling more depressed and wishes to go up on the sertraline.  Review of Systems     Objective:   Physical Exam  Constitutional: She appears well-developed and well-nourished. She does not appear ill. No distress.  Ears: T.M's intact without inflammation Throat: moderate tonsillar enlargement with mild erythema but no exudate Neck: no cervical adenopathy Lungs: clear     Assessment:    1. Tonsillitis: + for strep - POCT rapid strep A - amoxicillin (AMOXIL) 875 MG tablet; Take 1 tablet (875 mg total) by mouth 2 (two) times daily.  Dispense: 20 tablet; Refill: 0  2. PMDD (premenstrual dysphoric disorder)    Plan:    Increase sertraline to 100 mg with current supply and call in two weeks with progress report.

## 2017-10-07 ENCOUNTER — Other Ambulatory Visit: Payer: Self-pay | Admitting: Family Medicine

## 2017-10-08 ENCOUNTER — Other Ambulatory Visit: Payer: Self-pay | Admitting: Family Medicine

## 2017-10-10 ENCOUNTER — Other Ambulatory Visit: Payer: Self-pay | Admitting: Family Medicine

## 2017-10-10 MED ORDER — SERTRALINE HCL 100 MG PO TABS: 100.0000 mg | ORAL_TABLET | Freq: Every day | ORAL | 1 refills | Status: DC

## 2017-10-11 ENCOUNTER — Emergency Department
Admission: EM | Admit: 2017-10-11 | Discharge: 2017-10-11 | Disposition: A | Discharge status: Home / Self Care | Payer: 59 | Attending: Emergency Medicine | Admitting: Emergency Medicine

## 2017-10-11 ENCOUNTER — Other Ambulatory Visit: Payer: Self-pay

## 2017-10-11 ENCOUNTER — Telehealth: Payer: Self-pay

## 2017-10-11 ENCOUNTER — Emergency Department: Payer: 59

## 2017-10-11 ENCOUNTER — Encounter: Payer: Self-pay | Admitting: *Deleted

## 2017-10-11 DIAGNOSIS — Z87891 Personal history of nicotine dependence: Secondary | ICD-10-CM | POA: Diagnosis not present

## 2017-10-11 DIAGNOSIS — Z79899 Other long term (current) drug therapy: Secondary | ICD-10-CM | POA: Insufficient documentation

## 2017-10-11 DIAGNOSIS — M62830 Muscle spasm of back: Secondary | ICD-10-CM | POA: Insufficient documentation

## 2017-10-11 DIAGNOSIS — Y939 Activity, unspecified: Secondary | ICD-10-CM | POA: Diagnosis not present

## 2017-10-11 DIAGNOSIS — X58XXXA Exposure to other specified factors, initial encounter: Secondary | ICD-10-CM | POA: Diagnosis not present

## 2017-10-11 DIAGNOSIS — S39012A Strain of muscle, fascia and tendon of lower back, initial encounter: Secondary | ICD-10-CM | POA: Diagnosis not present

## 2017-10-11 DIAGNOSIS — Z85828 Personal history of other malignant neoplasm of skin: Secondary | ICD-10-CM | POA: Insufficient documentation

## 2017-10-11 DIAGNOSIS — M545 Low back pain: Secondary | ICD-10-CM | POA: Diagnosis not present

## 2017-10-11 DIAGNOSIS — R1012 Left upper quadrant pain: Secondary | ICD-10-CM | POA: Diagnosis not present

## 2017-10-11 DIAGNOSIS — Y998 Other external cause status: Secondary | ICD-10-CM | POA: Insufficient documentation

## 2017-10-11 DIAGNOSIS — S3992XA Unspecified injury of lower back, initial encounter: Secondary | ICD-10-CM | POA: Diagnosis present

## 2017-10-11 DIAGNOSIS — Y929 Unspecified place or not applicable: Secondary | ICD-10-CM | POA: Diagnosis not present

## 2017-10-11 LAB — URINALYSIS, COMPLETE (UACMP) WITH MICROSCOPIC
BILIRUBIN URINE: NEGATIVE
Bacteria, UA: NONE SEEN
Glucose, UA: NEGATIVE mg/dL
Hgb urine dipstick: NEGATIVE
KETONES UR: NEGATIVE mg/dL
Leukocytes, UA: NEGATIVE
NITRITE: NEGATIVE
PROTEIN: NEGATIVE mg/dL
Specific Gravity, Urine: 1.023 (ref 1.005–1.030)
pH: 6 (ref 5.0–8.0)

## 2017-10-11 LAB — POCT PREGNANCY, URINE: PREG TEST UR: NEGATIVE

## 2017-10-11 MED ORDER — ORPHENADRINE CITRATE 30 MG/ML IJ SOLN
60.0000 mg | INTRAMUSCULAR | Status: AC
Start: 2017-10-11 — End: 2017-10-11
  Administered 2017-10-11: 60 mg via INTRAMUSCULAR
  Filled 2017-10-11: qty 2

## 2017-10-11 MED ORDER — OXYCODONE-ACETAMINOPHEN 5-325 MG PO TABS
1.0000 | ORAL_TABLET | Freq: Once | ORAL | Status: AC
  Administered 2017-10-11: 1 via ORAL
  Filled 2017-10-11: qty 1

## 2017-10-11 MED ORDER — KETOROLAC TROMETHAMINE 10 MG PO TABS: 10.0000 mg | ORAL_TABLET | Freq: Three times a day (TID) | ORAL | 0 refills | Status: DC

## 2017-10-11 MED ORDER — KETOROLAC TROMETHAMINE 30 MG/ML IJ SOLN
30.0000 mg | Freq: Once | INTRAMUSCULAR | Status: AC
  Administered 2017-10-11: 30 mg via INTRAMUSCULAR
  Filled 2017-10-11: qty 1

## 2017-10-11 MED ORDER — HYDROCODONE-ACETAMINOPHEN 5-325 MG PO TABS: 1.0000 | ORAL_TABLET | Freq: Three times a day (TID) | ORAL | 0 refills | Status: AC | PRN

## 2017-10-11 MED ORDER — CYCLOBENZAPRINE HCL 5 MG PO TABS: ORAL_TABLET | ORAL | 0 refills | Status: DC

## 2017-10-11 MED ORDER — LIDOCAINE 5 % EX PTCH: 1.0000 | MEDICATED_PATCH | CUTANEOUS | 0 refills | Status: AC

## 2017-10-11 MED ORDER — DIAZEPAM 5 MG PO TABS
ORAL_TABLET | ORAL | Status: AC
  Filled 2017-10-11: qty 1

## 2017-10-11 MED ORDER — DIAZEPAM 5 MG PO TABS
5.0000 mg | ORAL_TABLET | Freq: Once | ORAL | Status: AC
  Administered 2017-10-11: 5 mg via ORAL

## 2017-10-11 NOTE — ED Notes (Signed)
Pt reports not voiding today and has the urgency to do so.  Pt also reports more pain in the left lower back.  No abd pain.  No n/v/d.  Pt alert  Speech clear.

## 2017-10-11 NOTE — ED Provider Notes (Addendum)
[facility] Emergency Department Provider Note ____________________________________________  Time seen: 1622  I have reviewed the triage vital signs and the nursing notes.  HISTORY  Chief Complaint  Back Pain  HPI Haley Moore is a 42 y.o. female who presents to the ED from home via EMS.  Patient complains of left-sided low back pain.  The patient describes she wrestles for extracurricular activity, but has not wrestled in over 2 weeks. She denies any recent or remote injury. She denies any history of chronic ongoing back pain.  She describes severe muscle spasms that debilitated her admitted difficult to get out of the bed.  Patient describes pain primarily to the left side of her lower back.  She denies any referral into the groin or buttocks.  She denies any pain referring down the left leg.  She also denies any bladder or bowel incontinence, saddle anesthesias, footdrop, or weakness.  She has taken 800 mg of ibuprofen this morning at the onset of her symptoms, but denies any medications in the interim.  Past Medical History:  Diagnosis Date  . Allergy   . Anxiety   . Depression   . Dysmenorrhea   . Malignant melanoma of skin of chest ([place])    2018  . Menorrhagia   . Migraine     Patient Active Problem List   Diagnosis Date Noted  . PMDD (premenstrual dysphoric disorder) 01/17/2017  . 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 History of chicken pox 12/08/2014  . History of colonic polyps 12/08/2014  . Genital warts 12/08/2014  . Premenstrual tension syndrome 12/08/2014  . Dysmenorrhea 07/16/2014  . Menorrhagia 07/16/2014  . Allergic rhinitis 01/20/2011  . Migraine headache without aura     Past Surgical History:  Procedure Laterality Date  . BREAST ENHANCEMENT SURGERY    . LAPAROSCOPIC TUBAL LIGATION N/A 10/25/2016   Procedure: LAPAROSCOPIC TUBAL LIGATION;  Surgeon: Malachy Mood, MD;  Location: [facility] ORS;  Service: Gynecology;   Laterality: N/A;  . SKIN CANCER EXCISION    . WRIST SURGERY      Prior to Admission medications   Medication Sig Start Date End Date Taking? Authorizing Provider  amoxicillin (AMOXIL) 875 MG tablet Take 1 tablet (875 mg total) by mouth 2 (two) times daily. 09/15/17   Carmon Ginsberg, PA  cetirizine (ZYRTEC) 10 MG tablet Take 10 mg by mouth as needed for allergies.    [provider]  Cholecalciferol (VITAMIN D-1000 MAX ST) 1000 units tablet Take 1 tablet by mouth daily.    [provider]  clonazePAM (KLONOPIN) 0.5 MG tablet Take 1 tablet (0.5 mg total) by mouth 2 (two) times daily as needed for anxiety. 07/05/17   Rexene Agent, CNM  COLLAGEN PO Take 1 tablet by mouth daily.    [provider]  cyclobenzaprine (FLEXERIL) 5 MG tablet Take 1-2 tabs, TID prn spasms 10/11/17   Hulon Ferron, Dannielle Karvonen, PA-C  EPIPEN 2-PAK 0.3 MG/0.3ML SOAJ injection  08/21/14   [provider]  furosemide (LASIX) 20 MG tablet One daily as needed for leg swelling 10/10/17   Carmon Ginsberg, PA  HYDROcodone-acetaminophen (NORCO) 5-325 MG tablet Take 1 tablet by mouth 3 (three) times daily as needed for up to 3 days. 10/11/17 10/14/17  Joseeduardo Brix, Dannielle Karvonen, PA-C  ibuprofen (ADVIL,MOTRIN) 600 MG tablet TAKE 1 TABLET BY MOUTH EVERY 6 HOURS IF NEEDED 10/30/16   [provider]  ketorolac (TORADOL) 10 MG tablet Take 1 tablet (10 mg total)  by mouth every 8 (eight) hours. 10/11/17   Krishana Lutze, Dannielle Karvonen, PA-C  lidocaine (LIDODERM) 5 % Place 1 patch onto the skin daily for 7 days. 10/11/17 10/18/17  Kealii Thueson, Dannielle Karvonen, PA-C  Multiple Vitamin (MULTIVITAMIN) tablet Take 1 tablet by mouth daily.    [provider]  Nutritional Supplements (DHEA PO) Take by mouth.    [provider]  omega-3 fish oil (MAXEPA) 1000 MG CAPS capsule Take 1 capsule by mouth daily.    [provider]  sertraline (ZOLOFT) 100 MG tablet Take 1 tablet (100 mg total) by mouth daily.  10/10/17   Carmon Ginsberg, PA    Allergies Shellfish allergy  Family History  Problem Relation Age of Onset  . Heart disease Mother   . Uterine cancer Maternal Grandmother 50       vs ovar ca    Social History Social History   Tobacco Use  . Smoking status: Former Smoker    Types: Cigarettes    Last attempt to quit: 02/07/1998    Years since quitting: 19.6  . Smokeless tobacco: Never Used  . Tobacco comment: 1 PACK WEEKLY  Substance Use Topics  . Alcohol use: Yes    Alcohol/week: 0.0 standard drinks    Comment: RARE  . Drug use: No    Review of Systems  Constitutional: Negative for fever. Cardiovascular: Negative for chest pain. Respiratory: Negative for shortness of breath. Gastrointestinal: Negative for abdominal pain, vomiting and diarrhea. Genitourinary: Negative for dysuria. Musculoskeletal: Positive for back pain. Skin: Negative for rash. Neurological: Negative for headaches, focal weakness or numbness. ____________________________________________  PHYSICAL EXAM:  VITAL SIGNS: ED Triage Vitals  Enc Vitals Group     BP 10/11/17 1558 (!) 143/85     Pulse Rate 10/11/17 1558 78     Resp 10/11/17 1558 20     Temp 10/11/17 1558 98.6 F (37 C)     Temp Source 10/11/17 1558 Oral     SpO2 10/11/17 1558 99 %     Weight 10/11/17 1559 191 lb (86.6 kg)     Height 10/11/17 1559 5\' 4"  (1.626 m)     Head Circumference --      Peak Flow --      Pain Score 10/11/17 1559 8     Pain Loc --      Pain Edu? --      Excl. in [place]? --     Constitutional: Alert and oriented. Well appearing and in no distress. Head: Normocephalic and atraumatic. Eyes: Conjunctivae are normal. Normal extraocular movements Cardiovascular: Normal rate, regular rhythm. Normal distal pulses. Respiratory: Normal respiratory effort. No wheezes/rales/rhonchi. Gastrointestinal: Soft and nontender. No distention. Musculoskeletal: Normal spinal alignment without midline tenderness, spasm, deformity,  or step-off.  Patient with tenderness to palpation over the left lumbar sacral junction.  She is able to demonstrate a negative supine straight leg raise.  Patient with normal hip flexion on exam.  She is able to perform a hip hike on the bed in the supine position.  Nontender with normal range of motion in all extremities.  Neurologic: Nerves II through XII grossly intact.  Normal LE DTRs bilaterally.  Normal toe dorsiflexion foot eversion on exam.  Patient with normal gross sensation distally.  Normal gait without ataxia. Normal speech and language. No gross focal neurologic deficits are appreciated. Skin:  Skin is warm, dry and intact. No rash noted. Psychiatric: Mood and affect are normal. Patient exhibits appropriate insight and judgment. ____________________________________________  LABS (pertinent positives/negatives)  Labs Reviewed  URINALYSIS, COMPLETE (UACMP) WITH MICROSCOPIC - Abnormal; Notable for the following components:      Result Value   Color, Urine YELLOW (*)    APPearance CLEAR (*)    All other components within normal limits  POCT PREGNANCY, URINE  POC URINE PREG, ED  ____________________________________________   RADIOLOGY  Lumbar Spine  IMPRESSION: No fracture or static subluxation of the lumbar spine. ____________________________________________  PROCEDURES  Procedures Toradol 30 mg IM Norflex 60 mg IM Percocet 5-325 mg PO Valium 5 mg PO ____________________________________________  INITIAL IMPRESSION / ASSESSMENT AND PLAN / ED COURSE  Patient with ED evaluation of sudden lumbar sacral muscle pain and spasm.  Patient's exam is overall benign and reassuring for any acute neuromuscular deficit or disc herniation.  Is no indication of any radicular symptoms on exam.  X-ray does not reveal any acute changes including fracture or dislocation.  Patient reports some improvement in her symptoms at rest and will be discharged with prescriptions for ketorolac,  Flexeril, Lidoderm patch, and hydrocodone.  She is encouraged to follow-up with her primary care provider or return to the ED for acutely worsening symptoms.  ----------------------------------------- 7:21 PM on 10/11/2017 ----------------------------------------- Notified by RN that patient was still experiencing severe muscle spasms on the attempt to transfer to the wheelchair. We will give a valium 5 mg PO.  ____________________________________________  FINAL CLINICAL IMPRESSION(S) / ED DIAGNOSES  Final diagnoses:  Strain of lumbar region, initial encounter  Muscle spasm of back      Melvenia Needles, PA-C 10/11/17 1911    Melvenia Needles, PA-C 10/11/17 1923    Orbie Pyo, MD 10/11/17 [phone]

## 2017-10-11 NOTE — ED Notes (Signed)
Patient unable to stand and move to leave. Spoke with PA and got new orders for new medication. Will try again later to d/c.

## 2017-10-11 NOTE — ED Triage Notes (Signed)
Pt brought in via ems from home with lower back pain.  No known injury.  Pt wrestles and may have hurt her back 3 days ago.  Pt alert.

## 2017-10-11 NOTE — Telephone Encounter (Signed)
Patient called office this afternoon wanting advise on what to do. Patient states that she was undressed in bed and accidentally thru hr back out. Patient denied anyone being at home and states that she had to crawl on bed to reach phone and to grab tylenol at her nightstand. I suggested to patient to call a family member or friend to see if they can help her out of bed because she would need to be evaluated for medical treatment, patient declined because she states she did not want anyone to see her undressed. After consulting with Dr. Caryn Section had advised that patient call EMS if she was unable to move out of her bed, patient firmly declined because she says she did not want anyone coming to her home when she was undressed. I urged patient to reconsider and to contact family member or EMS she declined and hung up. KW

## 2017-10-11 NOTE — ED Notes (Signed)
Poct pregnancy Negative 

## 2017-10-11 NOTE — Telephone Encounter (Signed)
See below

## 2017-10-11 NOTE — Discharge Instructions (Addendum)
Your exam and x-ray are reassuring, there is no indication of a nerve impingement or herniated disc. You appear to have muscle strain and spasms. Take the prescription meds as directed. Apply the pain patch as directed. Follow-up with your provider or return for worsening symptoms.

## 2017-10-20 ENCOUNTER — Encounter: Payer: Self-pay | Admitting: Family Medicine

## 2017-10-20 ENCOUNTER — Ambulatory Visit (INDEPENDENT_AMBULATORY_CARE_PROVIDER_SITE_OTHER): Payer: 59 | Admitting: Family Medicine

## 2017-10-20 VITALS — BP 102/78 | HR 82 | Temp 98.6°F | Resp 16 | Wt 189.8 lb

## 2017-10-20 DIAGNOSIS — S39012D Strain of muscle, fascia and tendon of lower back, subsequent encounter: Secondary | ICD-10-CM

## 2017-10-20 NOTE — Patient Instructions (Signed)
Start heat for 20 minutes several x day to relieve spasms. Continue ibuprofen 800 mg. 3 x day with meals. Continue to move within the limits of your back pain.

## 2017-10-20 NOTE — Progress Notes (Signed)
  Subjective:     Patient ID: Haley Moore, female   DOB: [birth year], 42 y.o.   MRN: [id number] Chief Complaint  Patient presents with  . Back Pain    Patient comes in office today to follow up in reference to back pain, patient was seen at [facility] ED 10/11/17 after calling EMS because she was unable to move out of her bed for 7hrs. Patient states that she was given prescription muscle relaxer and pain medication, she follwed up with chiropractor this week and had acupuncture but reports that pain is still present.   HPI States pain worsens with sitting and prolonged standing. No radiation of pain. 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  Review of Systems     Objective:   Physical Exam  Constitutional: She appears well-developed and well-nourished. No distress.  Musculoskeletal:  Muscle strength in lower extremities 5/5. SLR's to 90 degrees without radiation of back pain. Localizes to left paravertebral lumbar area.       Assessment:    1. Low back strain, subsequent encounter    Plan:    Discussed use of warm compresses and scheduling ibuprofen 800 mg 3 x day with meals. Made aware of natural history of back strains taking 2-4 weeks to get better and red flag sx.

## 2017-11-27 DIAGNOSIS — D2261 Melanocytic nevi of right upper limb, including shoulder: Secondary | ICD-10-CM | POA: Diagnosis not present

## 2017-11-27 DIAGNOSIS — D225 Melanocytic nevi of trunk: Secondary | ICD-10-CM | POA: Diagnosis not present

## 2017-11-27 DIAGNOSIS — L820 Inflamed seborrheic keratosis: Secondary | ICD-10-CM | POA: Diagnosis not present

## 2017-11-27 DIAGNOSIS — D485 Neoplasm of uncertain behavior of skin: Secondary | ICD-10-CM | POA: Diagnosis not present

## 2017-11-29 ENCOUNTER — Other Ambulatory Visit: Payer: Self-pay | Admitting: Family Medicine

## 2017-12-14 ENCOUNTER — Telehealth: Payer: Self-pay | Admitting: Family Medicine

## 2017-12-14 ENCOUNTER — Other Ambulatory Visit: Payer: Self-pay | Admitting: Family Medicine

## 2017-12-14 MED ORDER — FUROSEMIDE 20 MG PO TABS: ORAL_TABLET | ORAL | 0 refills | Status: DC

## 2017-12-14 NOTE — Telephone Encounter (Signed)
Pt needing to discuss past mold issues where pt she used to live.  Needing to discuss if mold could have caused the strep throat she was diagnosed with when she came to the office.  Please advise.  Thanks, [employer]

## 2017-12-14 NOTE — Telephone Encounter (Signed)
Please advise 

## 2017-12-15 NOTE — Telephone Encounter (Signed)
Discussed.

## 2018-02-14 ENCOUNTER — Other Ambulatory Visit: Payer: Self-pay | Admitting: Family Medicine

## 2018-04-29 ENCOUNTER — Ambulatory Visit
Admission: EM | Admit: 2018-04-29 | Discharge: 2018-04-29 | Disposition: A | Discharge status: Home / Self Care | Payer: BLUE CROSS/BLUE SHIELD | Attending: Family Medicine | Admitting: Family Medicine

## 2018-04-29 ENCOUNTER — Other Ambulatory Visit: Payer: Self-pay

## 2018-04-29 DIAGNOSIS — R69 Illness, unspecified: Principal | ICD-10-CM

## 2018-04-29 DIAGNOSIS — R509 Fever, unspecified: Secondary | ICD-10-CM

## 2018-04-29 DIAGNOSIS — J111 Influenza due to unidentified influenza virus with other respiratory manifestations: Secondary | ICD-10-CM

## 2018-04-29 DIAGNOSIS — R05 Cough: Secondary | ICD-10-CM

## 2018-04-29 DIAGNOSIS — R0981 Nasal congestion: Secondary | ICD-10-CM

## 2018-04-29 MED ORDER — OSELTAMIVIR PHOSPHATE 75 MG PO CAPS: 75.0000 mg | ORAL_CAPSULE | Freq: Two times a day (BID) | ORAL | 0 refills | Status: DC

## 2018-04-29 NOTE — ED Triage Notes (Signed)
Pt here for allergies for [redacted] week along with cough and runny nose. Then for about 6 days started with the body aches, chills and headache. Did have a 100.9 temp this morning. Did take some nyquil and states she is feeling better. No sore throat reported.

## 2018-04-29 NOTE — ED Provider Notes (Signed)
[facility]    CSN: [id number] Arrival date & time: 04/29/18  4196     History   Chief Complaint Chief Complaint  Patient presents with  . Generalized Body Aches    appt    HPI Haley Moore is a 43 y.o. female.   The history is provided by the patient.  URI  Presenting symptoms: congestion, cough, fever (started this morning) and rhinorrhea   Severity:  Moderate Onset quality:  Sudden Duration:  5 days (fever started today) Timing:  Constant Progression:  Worsening Chronicity:  New Relieved by:  OTC medications Associated symptoms: headaches and myalgias   Associated symptoms: no wheezing   Risk factors: no recent travel and no sick contacts     Past Medical History:  Diagnosis Date  . Allergy   . Anxiety   . Depression   . Dysmenorrhea   . Malignant melanoma of skin of chest ([place])    2018  . Menorrhagia   . Migraine     Patient Active Problem List   Diagnosis Date Noted  . PMDD (premenstrual dysphoric disorder) 01/17/2017  . 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 History of chicken pox 12/08/2014  . History of colonic polyps 12/08/2014  . Genital warts 12/08/2014  . Premenstrual tension syndrome 12/08/2014  . Dysmenorrhea 07/16/2014  . Menorrhagia 07/16/2014  . Allergic rhinitis 01/20/2011  . Migraine headache without aura     Past Surgical History:  Procedure Laterality Date  . BREAST ENHANCEMENT SURGERY    . LAPAROSCOPIC TUBAL LIGATION N/A 10/25/2016   Procedure: LAPAROSCOPIC TUBAL LIGATION;  Surgeon: Malachy Mood, MD;  Location: [facility] ORS;  Service: Gynecology;  Laterality: N/A;  . SKIN CANCER EXCISION    . WRIST SURGERY      OB History    Gravida  0   Para  0   Term  0   Preterm  0   AB  0   Living        SAB  0   TAB  0   Ectopic  0   Multiple      Live Births               Home Medications    Prior to Admission medications   Medication Sig Start Date End Date Taking?  Authorizing Provider  cetirizine (ZYRTEC) 10 MG tablet Take 10 mg by mouth as needed for allergies.   Yes [provider]  clonazePAM (KLONOPIN) 0.5 MG tablet Take 1 tablet (0.5 mg total) by mouth 2 (two) times daily as needed for anxiety. 07/05/17  Yes Rexene Agent, CNM  EPIPEN 2-PAK 0.3 MG/0.3ML SOAJ injection  08/21/14  Yes [provider]  furosemide (LASIX) 20 MG tablet TAKE 1 TABLET BY MOUTH AS NEEDED FOR LEG SWELLING 12/14/17  Yes Carmon Ginsberg, PA  Cholecalciferol (VITAMIN D-1000 MAX ST) 1000 units tablet Take 1 tablet by mouth daily.    [provider]  COLLAGEN PO Take 1 tablet by mouth daily.    [provider]  ibuprofen (ADVIL,MOTRIN) 600 MG tablet TAKE 1 TABLET BY MOUTH EVERY 6 HOURS IF NEEDED 10/30/16   [provider]  Multiple Vitamin (MULTIVITAMIN) tablet Take 1 tablet by mouth daily.    [provider]  Nutritional Supplements (DHEA PO) Take by mouth.    [provider]  omega-3 fish oil (MAXEPA) 1000 MG CAPS capsule Take 1 capsule by mouth daily.    [provider]  oseltamivir (TAMIFLU)  75 MG capsule Take 1 capsule (75 mg total) by mouth 2 (two) times daily. 04/29/18   Norval Gable, MD  sertraline (ZOLOFT) 100 MG tablet Take 1 tablet (100 mg total) by mouth daily. 10/10/17   Carmon Ginsberg, PA    Family History Family History  Problem Relation Age of Onset  . Heart disease Mother   . Uterine cancer Maternal Grandmother 50       vs ovar ca    Social History Social History   Tobacco Use  . Smoking status: Former Smoker    Types: Cigarettes    Last attempt to quit: 02/07/1998    Years since quitting: 20.2  . Smokeless tobacco: Never Used  . Tobacco comment: 1 PACK WEEKLY  Substance Use Topics  . Alcohol use: Yes    Alcohol/week: 0.0 standard drinks    Comment: RARE  . Drug use: No     Allergies   Shellfish allergy   Review of Systems Review of Systems  Constitutional: Positive for  fever (started this morning).  HENT: Positive for congestion and rhinorrhea.   Respiratory: Positive for cough. Negative for wheezing.   Musculoskeletal: Positive for myalgias.  Neurological: Positive for headaches.     Physical Exam Triage Vital Signs ED Triage Vitals  Enc Vitals Group     BP 04/29/18 0948 100/72     Pulse Rate 04/29/18 0948 81     Resp 04/29/18 0948 18     Temp 04/29/18 0948 98.9 F (37.2 C)     Temp Source 04/29/18 0948 Oral     SpO2 04/29/18 0948 99 %     Weight 04/29/18 0950 200 lb (90.7 kg)     Height 04/29/18 0950 5\' 4"  (1.626 m)     Head Circumference --      Peak Flow --      Pain Score 04/29/18 0950 5     Pain Loc --      Pain Edu? --      Excl. in [place]? --    No data found.  Updated Vital Signs BP 100/72 (BP Location: Left Arm)   Pulse 81   Temp 98.9 F (37.2 C) (Oral)   Resp 18   Ht 5\' 4"  (1.626 m)   Wt 90.7 kg   SpO2 99%   BMI 34.33 kg/m   Visual Acuity Right Eye Distance:   Left Eye Distance:   Bilateral Distance:    Right Eye Near:   Left Eye Near:    Bilateral Near:     Physical Exam Vitals signs and nursing note reviewed.  Constitutional:      General: She is not in acute distress.    Appearance: She is well-developed. She is not toxic-appearing or diaphoretic.  HENT:     Head: Normocephalic and atraumatic.     Nose: Rhinorrhea present. No septal deviation.     Mouth/Throat:     Pharynx: Uvula midline. No oropharyngeal exudate.  Eyes:     General: No scleral icterus.       Right eye: No discharge.        Left eye: No discharge.     Conjunctiva/sclera: Conjunctivae normal.     Pupils: Pupils are equal, round, and reactive to light.  Neck:     Musculoskeletal: Normal range of motion and neck supple.     Thyroid: No thyromegaly.  Cardiovascular:     Rate and Rhythm: Normal rate and regular rhythm.     Heart sounds: Normal heart  sounds.  Pulmonary:     Effort: Pulmonary effort is normal. No respiratory distress.      Breath sounds: Normal breath sounds. No stridor. No wheezing, rhonchi or rales.  Lymphadenopathy:     Cervical: No cervical adenopathy.  Neurological:     Mental Status: She is alert.      UC Treatments / Results  Labs (all labs ordered are listed, but only abnormal results are displayed) Labs Reviewed - No data to display  EKG None  Radiology No results found.  Procedures Procedures (including critical care time)  Medications Ordered in UC Medications - No data to display  Initial Impression / Assessment and Plan / UC Course  I have reviewed the triage vital signs and the nursing notes.  Pertinent labs & imaging results that were available during my care of the patient were reviewed by me and considered in my medical decision making (see chart for details).      Final Clinical Impressions(s) / UC Diagnoses   Final diagnoses:  Influenza-like illness    ED Prescriptions    Medication Sig Dispense Auth. Provider   oseltamivir (TAMIFLU) 75 MG capsule Take 1 capsule (75 mg total) by mouth 2 (two) times daily. 10 capsule Norval Gable, MD      1. diagnosis reviewed with patient 2. rx as per orders above; reviewed possible side effects, interactions, risks and benefits  3. Recommend supportive treatment with rest, fluids, otc meds prn; information given regarding self-isolation, quarantine 4. Follow-up prn if symptoms worsen or don't improve  Controlled Substance Prescriptions [place] Controlled Substance Registry consulted? Not Applicable   Norval Gable, MD 04/29/18 1025

## 2018-05-08 ENCOUNTER — Other Ambulatory Visit: Payer: Self-pay | Admitting: Family Medicine

## 2018-05-08 DIAGNOSIS — F419 Anxiety disorder, unspecified: Secondary | ICD-10-CM

## 2018-05-08 MED ORDER — SERTRALINE HCL 100 MG PO TABS: 100.0000 mg | ORAL_TABLET | Freq: Every day | ORAL | 0 refills | Status: DC

## 2018-05-08 NOTE — Telephone Encounter (Signed)
Needs to establish with Dr. B she hasn't been seen for anxiety in > 1 year. Sent in 30 days.

## 2018-05-08 NOTE — Telephone Encounter (Signed)
[facility] faxed refill request for the following medications: ° °sertraline (ZOLOFT) 100 MG tablet  ° °Please advise. ° °

## 2018-05-08 NOTE — Telephone Encounter (Signed)
Please Review Last office visit for Anxiety was 12/13/2016 Last OV with Ch[facility] was 10/20/2017

## 2018-05-09 NOTE — Telephone Encounter (Signed)
Patient advised as below. Patient declined to schedule an appt.

## 2018-05-22 ENCOUNTER — Other Ambulatory Visit: Payer: Self-pay | Admitting: Physician Assistant

## 2018-05-22 DIAGNOSIS — F419 Anxiety disorder, unspecified: Secondary | ICD-10-CM

## 2018-05-22 NOTE — Telephone Encounter (Signed)
Needs appointment, can set up e-visit with Dr. Jacinto Reap.

## 2018-05-22 NOTE — Telephone Encounter (Signed)
Patient states she did not request refill. She states if anything changes she will call back and reschedule appointment.

## 2018-05-22 NOTE — Telephone Encounter (Signed)
Please Review

## 2018-10-09 ENCOUNTER — Ambulatory Visit (INDEPENDENT_AMBULATORY_CARE_PROVIDER_SITE_OTHER): Payer: Self-pay | Admitting: Physician Assistant

## 2018-10-09 ENCOUNTER — Encounter: Payer: Self-pay | Admitting: Physician Assistant

## 2018-10-09 DIAGNOSIS — R609 Edema, unspecified: Secondary | ICD-10-CM

## 2018-10-09 DIAGNOSIS — F3281 Premenstrual dysphoric disorder: Secondary | ICD-10-CM

## 2018-10-09 DIAGNOSIS — F418 Other specified anxiety disorders: Secondary | ICD-10-CM

## 2018-10-09 MED ORDER — CLONAZEPAM 0.5 MG PO TABS: 0.5000 mg | ORAL_TABLET | Freq: Two times a day (BID) | ORAL | 0 refills | Status: DC | PRN

## 2018-10-09 MED ORDER — FUROSEMIDE 20 MG PO TABS: ORAL_TABLET | ORAL | 0 refills | Status: DC

## 2018-10-09 MED ORDER — FLUOXETINE HCL 20 MG PO TABS: 20.0000 mg | ORAL_TABLET | Freq: Every day | ORAL | 3 refills | Status: DC

## 2018-10-09 NOTE — Progress Notes (Signed)
Patient: Haley Moore Female    DOB: 11/14/1975   43 y.o.   MRN: [id number] Visit Date: 10/09/2018  Today's Provider: Mar Daring, PA-C   Chief Complaint  Patient presents with  . Anxiety   Subjective:    Virtual Visit via Video Note  I connected with Haley Moore on 10/09/18 at  3:40 PM EDT by a video enabled telemedicine application and verified that I am speaking with the correct person using two identifiers.  Location: Patient: Home Provider: [facility]   I discussed the limitations of evaluation and management by telemedicine and the availability of in person appointments. The patient expressed understanding and agreed to proceed.  HPI  Anxiety Patient presents via video visit for anxiety. Patient last office visit was 07/31/2017. Patient is not taking any medication at the moment. She reports her symptoms have worsened over the last 6 months. She has had symptoms occurring most often around her menstrual cycle. She reports having bad menstrual cycles and mood issues around her menstrual cycle. She has tried hormonal treatments, including IUD, and had terrible adverse effects. She has increased irritability, anxiety and depression (tearful). She reports having issues with a roommate also that is making the symptoms worse. She has tried Nortriptyline in the past (2017) and it caused diarrhea. She also has tried Sertraline and was titrated up to 100mg , reports she felt it just didn't work.   GAD 7 : Generalized Anxiety Score 10/09/2018  Nervous, Anxious, on Edge 2  Control/stop worrying 2  Worry too much - different things 2  Trouble relaxing 2  Restless 2  Easily annoyed or irritable 2  Afraid - awful might happen 2  Total GAD 7 Score 14  Anxiety Difficulty Extremely difficult      Allergies  Allergen Reactions  . Shellfish Allergy      Current Outpatient Medications:  .  cetirizine (ZYRTEC) 10 MG tablet, Take 10 mg by mouth as needed for  allergies., Disp: , Rfl:  .  Cholecalciferol (VITAMIN D-1000 MAX ST) 1000 units tablet, Take 1 tablet by mouth daily., Disp: , Rfl:  .  COLLAGEN PO, Take 1 tablet by mouth daily., Disp: , Rfl:  .  furosemide (LASIX) 20 MG tablet, TAKE 1 TABLET BY MOUTH AS NEEDED FOR LEG SWELLING, Disp: 90 tablet, Rfl: 0 .  ibuprofen (ADVIL,MOTRIN) 600 MG tablet, TAKE 1 TABLET BY MOUTH EVERY 6 HOURS IF NEEDED, Disp: , Rfl: 0 .  Multiple Vitamin (MULTIVITAMIN) tablet, Take 1 tablet by mouth daily., Disp: , Rfl:  .  Nutritional Supplements (DHEA PO), Take by mouth., Disp: , Rfl:  .  omega-3 fish oil (MAXEPA) 1000 MG CAPS capsule, Take 1 capsule by mouth daily., Disp: , Rfl:  .  clonazePAM (KLONOPIN) 0.5 MG tablet, Take 1 tablet (0.5 mg total) by mouth 2 (two) times daily as needed for anxiety. (Patient not taking: Reported on 10/09/2018), Disp: 30 tablet, Rfl: 0 .  EPIPEN 2-PAK 0.3 MG/0.3ML SOAJ injection, , Disp: , Rfl: 0 .  oseltamivir (TAMIFLU) 75 MG capsule, Take 1 capsule (75 mg total) by mouth 2 (two) times daily. (Patient not taking: Reported on 10/09/2018), Disp: 10 capsule, Rfl: 0 .  sertraline (ZOLOFT) 100 MG tablet, Take 1 tablet (100 mg total) by mouth daily for 30 days., Disp: 30 tablet, Rfl: 0  Review of Systems  Constitutional: Negative.   Respiratory: Negative.   Genitourinary: Negative.   Neurological: Negative.   Psychiatric/Behavioral: Positive for agitation, decreased  concentration and dysphoric mood. Negative for self-injury, sleep disturbance and suicidal ideas. The patient is nervous/anxious.     Social History   Tobacco Use  . Smoking status: Former Smoker    Types: Cigarettes    Quit date: 02/07/1998    Years since quitting: 20.6  . Smokeless tobacco: Never Used  . Tobacco comment: 1 PACK WEEKLY  Substance Use Topics  . Alcohol use: Yes    Alcohol/week: 0.0 standard drinks    Comment: RARE      Objective:   There were no vitals taken for this visit. There were no vitals filed  for this visit.There is no height or weight on file to calculate BMI.   Physical Exam Vitals signs reviewed.  Constitutional:      General: She is not in acute distress.    Appearance: She is well-developed. She is not ill-appearing.  HENT:     Head: Normocephalic and atraumatic.  Neck:     Musculoskeletal: Normal range of motion and neck supple.  Pulmonary:     Effort: Pulmonary effort is normal. No respiratory distress.  Neurological:     Mental Status: She is alert.  Psychiatric:        Attention and Perception: Attention and perception normal.        Mood and Affect: Mood is anxious and depressed. Affect is tearful.        Speech: Speech normal.        Behavior: Behavior normal. Behavior is cooperative.        Thought Content: Thought content normal.        Judgment: Judgment normal.      No results found for any visits on 10/09/18.     Assessment & Plan     1. PMDD (premenstrual dysphoric disorder) Has failed Nortriptyline and sertraline. Will try fluoxetine as below. Clonazepam x 1 month for prn use while fluoxetine is titrated up. I will see her back in 4-6 weeks.  - clonazePAM (KLONOPIN) 0.5 MG tablet; Take 1 tablet (0.5 mg total) by mouth 2 (two) times daily as needed for anxiety.  Dispense: 60 tablet; Refill: 0 - FLUoxetine (PROZAC) 20 MG tablet; Take 1 tablet (20 mg total) by mouth daily.  Dispense: 30 tablet; Refill: 3  2. Situational anxiety See above medical treatment plan. - clonazePAM (KLONOPIN) 0.5 MG tablet; Take 1 tablet (0.5 mg total) by mouth 2 (two) times daily as needed for anxiety.  Dispense: 60 tablet; Refill: 0 - FLUoxetine (PROZAC) 20 MG tablet; Take 1 tablet (20 mg total) by mouth daily.  Dispense: 30 tablet; Refill: 3  3. Edema, unspecified type Stable. Diagnosis pulled for medication refill. Continue current medical treatment plan. - furosemide (LASIX) 20 MG tablet; TAKE 1 TABLET BY MOUTH AS NEEDED FOR LEG SWELLING  Dispense: 90 tablet;  Refill: 0   I discussed the assessment and treatment plan with the patient. The patient was provided an opportunity to ask questions and all were answered. The patient agreed with the plan and demonstrated an understanding of the instructions.   The patient was advised to call back or seek an in-person evaluation if the symptoms worsen or if the condition fails to improve as anticipated.  I provided 30 minutes of non-face-to-face time during this encounter.  I spent approximately 30 minutes with the patient today. Over 50% of this time was spent with counseling and educating the patient.    Mar Daring, PA-C  [facility]

## 2018-10-09 NOTE — Patient Instructions (Signed)
Premenstrual Syndrome Premenstrual syndrome (PMS) is a group of physical, emotional, and behavioral symptoms that affect women of childbearing age as part of their menstrual cycle. PMS starts 1-2 weeks before the start of a woman's menstrual period and goes away a few days after menstrual bleeding starts. It often happens in a predictable pattern (recurs). PMS may cause other health conditions to become worse, such as asthma, allergies, and migraines. PMS can range from mild to severe. When it is severe, it is called premenstrual dysphoric disorder (PMDD). PMS may interfere with normal daily activities. What are the causes? The cause of this condition is not known, but it seems to be related to hormone changes that happen before menstruation. What are the signs or symptoms? Symptoms of this condition often happen every month. They go away completely after your period starts. Physical symptoms of this condition include:  Bloating.  Breast pain.  Headaches.  Extreme fatigue.  Backaches.  Swelling of the hands and feet.  Weight gain.  Hot flashes. Emotional and behavioral symptoms of this condition include:  Mood swings.  Depression.  Angry outbursts.  Irritability.  Anxiety.  Crying spells.  Food cravings or appetite changes.  Changes in sexual desire.  Confusion.  Aggression.  Social withdrawal.  Poor concentration. How is this diagnosed? This condition may be diagnosed based on a history of your symptoms. This condition is generally diagnosed if symptoms of PMS:  Are present in the 5 days before your period starts.  End within 4 days after your period starts.  Happen at least 3 months in a row.  Interfere with some of your normal activities. Other conditions that can cause some of these symptoms must be ruled out before PMS can be diagnosed. These include depression, anxiety, anemia, and thyroid problems. How is this treated? This condition may be treated  by:  Maintaining a healthy lifestyle. This includes eating a well-balanced diet and exercising regularly.  Taking medicines. Medicines can help relieve symptoms such as cramps, aches, pains, headaches, and breast tenderness. Depending on the severity of the condition, your health care provider may recommend various over-the-counter pain medicines. Follow these instructions at home: Eating and drinking   Eat a well-balanced diet.  Avoid caffeine and alcohol.  Limit the amount of salt and salty foods you eat. This will help reduce bloating.  Drink enough fluid to keep your urine pale yellow.  Take a multivitamin if told to do so by your health care provider. Lifestyle   Do not use any products that contain nicotine or tobacco, such as cigarettes, e-cigarettes, and chewing tobacco. If you need help quitting, ask your health care provider.  Exercise regularly as suggested by your health care provider.  Get enough sleep. For most adults, this is 7-8 hours of sleep each night.  Practice relaxation techniques such as yoga, tai chi, or meditation.  Find healthy ways to manage stress. General instructions   For 2-3 months, write down your symptoms, their severity, and how long they last. This will help your health care provider choose the best treatment for you.  Take over-the-counter and prescription medicines only as told by your health care provider.  If you are using birth control pills (oral contraceptives), use them as told by your health care provider. Contact a health care provider if:  Your symptoms get worse.  You develop new symptoms.  You have trouble doing your daily activities. Summary  Premenstrual syndrome (PMS) is a group of physical, emotional, and behavioral symptoms that  affect women of childbearing age.  PMS starts 1-2 weeks before the start of a woman's period and goes away a few days after the period starts.  PMS is treated by maintaining a healthy  lifestyle and taking medicines to relieve the symptoms. This information is not intended to replace advice given to you by your health care provider. Make sure you discuss any questions you have with your health care provider. Document Released: 01/22/2000 Document Revised: 09/06/2017 Document Reviewed: 09/06/2017 Elsevier Patient Education  [employer]. Fluoxetine capsules or tablets (Depression/Mood Disorders) What is this medicine? FLUOXETINE (floo OX e teen) belongs to a class of drugs known as selective serotonin reuptake inhibitors (SSRIs). It helps to treat mood problems such as depression, obsessive compulsive disorder, and panic attacks. It can also treat certain eating disorders. This medicine may be used for other purposes; ask your health care provider or pharmacist if you have questions. COMMON BRAND NAME(S): Prozac What should I tell my health care provider before I take this medicine? They need to know if you have any of these conditions:  bipolar disorder or a family history of bipolar disorder  bleeding disorders  glaucoma  heart disease  liver disease  low levels of sodium in the blood  seizures  suicidal thoughts, plans, or attempt; a previous suicide attempt by you or a family member  take MAOIs like Carbex, Eldepryl, Marplan, Nardil, and Parnate  take medicines that treat or prevent blood clots  thyroid disease  an unusual or allergic reaction to fluoxetine, other medicines, foods, dyes, or preservatives  pregnant or trying to get pregnant  breast-feeding How should I use this medicine? Take this medicine by mouth with a glass of water. Follow the directions on the prescription label. You can take this medicine with or without food. Take your medicine at regular intervals. Do not take it more often than directed. Do not stop taking this medicine suddenly except upon the advice of your doctor. Stopping this medicine too quickly may cause serious side  effects or your condition may worsen. A special MedGuide will be given to you by the pharmacist with each prescription and refill. Be sure to read this information carefully each time. Talk to your pediatrician regarding the use of this medicine in children. While this drug may be prescribed for children as young as 7 years for selected conditions, precautions do apply. Overdosage: If you think you have taken too much of this medicine contact a poison control center or emergency room at once. NOTE: This medicine is only for you. Do not share this medicine with others. What if I miss a dose? If you miss a dose, skip the missed dose and go back to your regular dosing schedule. Do not take double or extra doses. What may interact with this medicine? Do not take this medicine with any of the following medications:  other medicines containing fluoxetine, like Sarafem or Symbyax  cisapride  dronedarone  linezolid  MAOIs like Carbex, Eldepryl, Marplan, Nardil, and Parnate  methylene blue (injected into a vein)  pimozide  thioridazine This medicine may also interact with the following medications:  alcohol  amphetamines  aspirin and aspirin-like medicines  carbamazepine  certain medicines for depression, anxiety, or psychotic disturbances  certain medicines for migraine headaches like almotriptan, eletriptan, frovatriptan, naratriptan, rizatriptan, sumatriptan, zolmitriptan  digoxin  diuretics  fentanyl  flecainide  furazolidone  isoniazid  lithium  medicines for sleep  medicines that treat or prevent blood clots like warfarin, enoxaparin,  and dalteparin  NSAIDs, medicines for pain and inflammation, like ibuprofen or naproxen  other medicines that prolong the QT interval (an abnormal heart rhythm)  phenytoin  procarbazine  propafenone  rasagiline  ritonavir  supplements like St. John's wort, kava kava,  valerian  tramadol  tryptophan  vinblastine This list may not describe all possible interactions. Give your health care provider a list of all the medicines, herbs, non-prescription drugs, or dietary supplements you use. Also tell them if you smoke, drink alcohol, or use illegal drugs. Some items may interact with your medicine. What should I watch for while using this medicine? Tell your doctor if your symptoms do not get better or if they get worse. Visit your doctor or health care professional for regular checks on your progress. Because it may take several weeks to see the full effects of this medicine, it is important to continue your treatment as prescribed by your doctor. Patients and their families should watch out for new or worsening thoughts of suicide or depression. Also watch out for sudden changes in feelings such as feeling anxious, agitated, panicky, irritable, hostile, aggressive, impulsive, severely restless, overly excited and hyperactive, or not being able to sleep. If this happens, especially at the beginning of treatment or after a change in dose, call your health care professional. Dennis Bast may get drowsy or dizzy. Do not drive, use machinery, or do anything that needs mental alertness until you know how this medicine affects you. Do not stand or sit up quickly, especially if you are an older patient. This reduces the risk of dizzy or fainting spells. Alcohol may interfere with the effect of this medicine. Avoid alcoholic drinks. Your mouth may get dry. Chewing sugarless gum or sucking hard candy, and drinking plenty of water may help. Contact your doctor if the problem does not go away or is severe. This medicine may affect blood sugar levels. If you have diabetes, check with your doctor or health care professional before you change your diet or the dose of your diabetic medicine. What side effects may I notice from receiving this medicine? Side effects that you should report to  your doctor or health care professional as soon as possible:  allergic reactions like skin rash, itching or hives, swelling of the face, lips, or tongue  anxious  black, tarry stools  breathing problems  changes in vision  confusion  elevated mood, decreased need for sleep, racing thoughts, impulsive behavior  eye pain  fast, irregular heartbeat  feeling faint or lightheaded, falls  feeling agitated, angry, or irritable  hallucination, loss of contact with reality  loss of balance or coordination  loss of memory  painful or prolonged erections  restlessness, pacing, inability to keep still  seizures  stiff muscles  suicidal thoughts or other mood changes  trouble sleeping  unusual bleeding or bruising  unusually weak or tired  vomiting Side effects that usually do not require medical attention (report to your doctor or health care professional if they continue or are bothersome):  change in appetite or weight  change in sex drive or performance  diarrhea  dry mouth  headache  increased sweating  nausea  tremors This list may not describe all possible side effects. Call your doctor for medical advice about side effects. You may report side effects to FDA at 1-800-FDA-1088. Where should I keep my medicine? Keep out of the reach of children. Store at room temperature between 15 and 30 degrees C (59 and 86 degrees F).  Throw away any unused medicine after the expiration date. NOTE: This sheet is a summary. It may not cover all possible information. If you have questions about this medicine, talk to your doctor, pharmacist, or health care provider.  2020 Elsevier/Gold Standard (2017-09-14 11:56:53)

## 2018-10-10 ENCOUNTER — Other Ambulatory Visit: Payer: Self-pay

## 2018-10-10 DIAGNOSIS — F418 Other specified anxiety disorders: Secondary | ICD-10-CM

## 2018-10-10 DIAGNOSIS — F3281 Premenstrual dysphoric disorder: Secondary | ICD-10-CM

## 2018-10-10 MED ORDER — FLUOXETINE HCL 20 MG PO CAPS: 20.0000 mg | ORAL_CAPSULE | Freq: Every day | ORAL | 3 refills | Status: DC

## 2018-10-10 NOTE — Telephone Encounter (Signed)
Patient reports that the Fluoxetine is $85 dollars not 4 dollars. That the provider sent the wrong form of medication was called in and she can't afford it. Medication changed to Capsule.

## 2018-10-11 ENCOUNTER — Telehealth: Payer: Self-pay | Admitting: Physician Assistant

## 2018-10-11 NOTE — Telephone Encounter (Signed)
Pt thinks her medication sent to the wrong place. Generic Prozac 20 mg has to be the capsul  It was sent [facility] and should have been sent to [facility] in [place].  teri

## 2018-10-11 NOTE — Telephone Encounter (Signed)
Haley Moore can this be sent to the other pharmacy, it looks like it was sent to [facility]

## 2018-10-11 NOTE — Telephone Encounter (Signed)
Changed to Haley Moore

## 2018-10-19 ENCOUNTER — Other Ambulatory Visit: Payer: Self-pay | Admitting: Physician Assistant

## 2018-10-19 DIAGNOSIS — F418 Other specified anxiety disorders: Secondary | ICD-10-CM

## 2018-10-19 DIAGNOSIS — F3281 Premenstrual dysphoric disorder: Secondary | ICD-10-CM

## 2018-11-16 IMAGING — CR DG LUMBAR SPINE COMPLETE 4+V
1 series · 5 of 5 positions shown · non-contrast
Comparison: None.

CLINICAL DATA: Low back pain

EXAM:
LUMBAR SPINE - COMPLETE 4+ VIEW

[Series 1: dg lumbar spine complete 4 +v · 0.14mm/px · 5 of 5 slices shown]
[im 1/5]
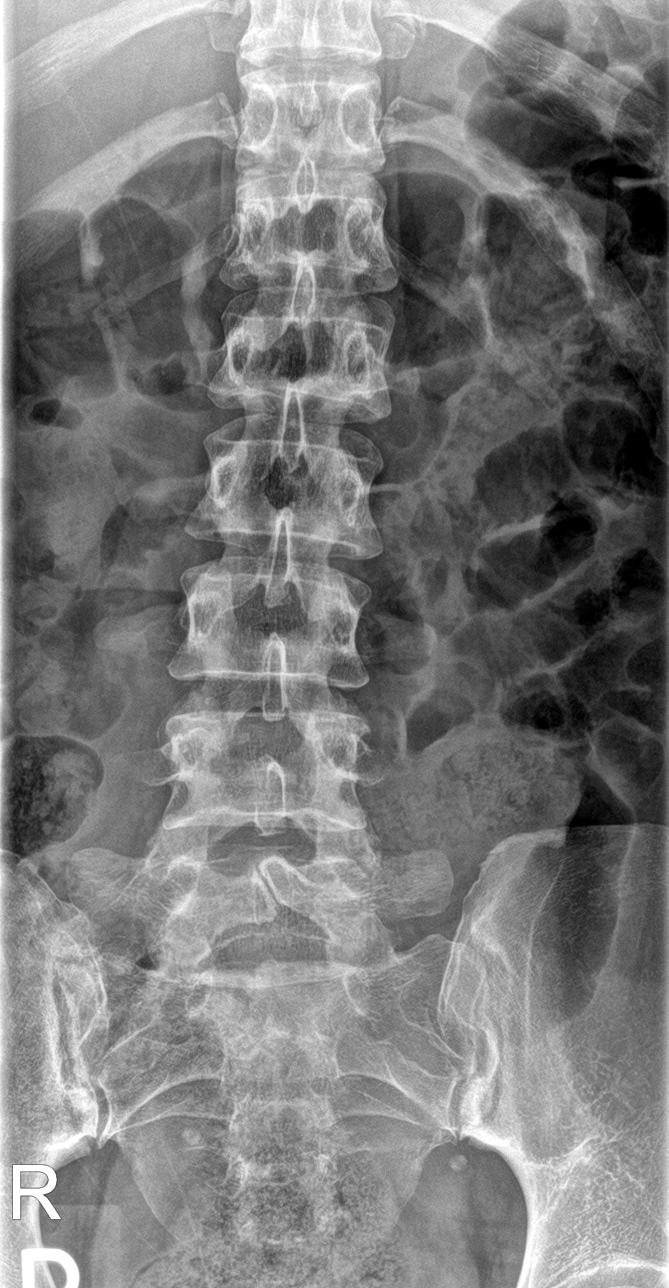
[im 2/5]
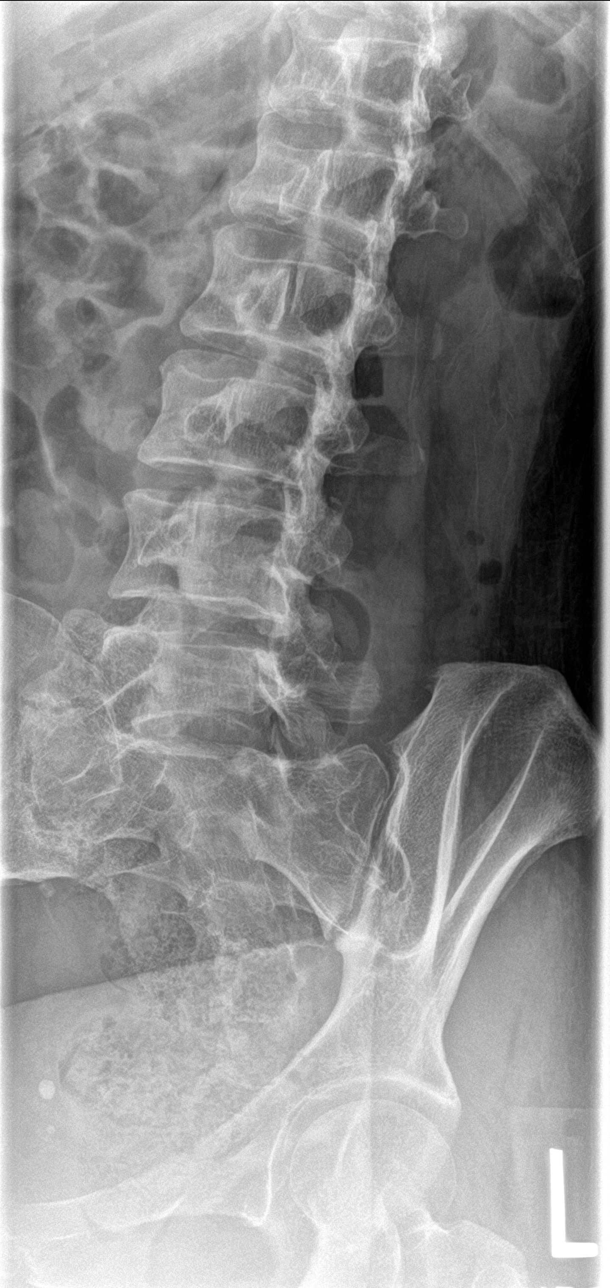
[im 3/5]
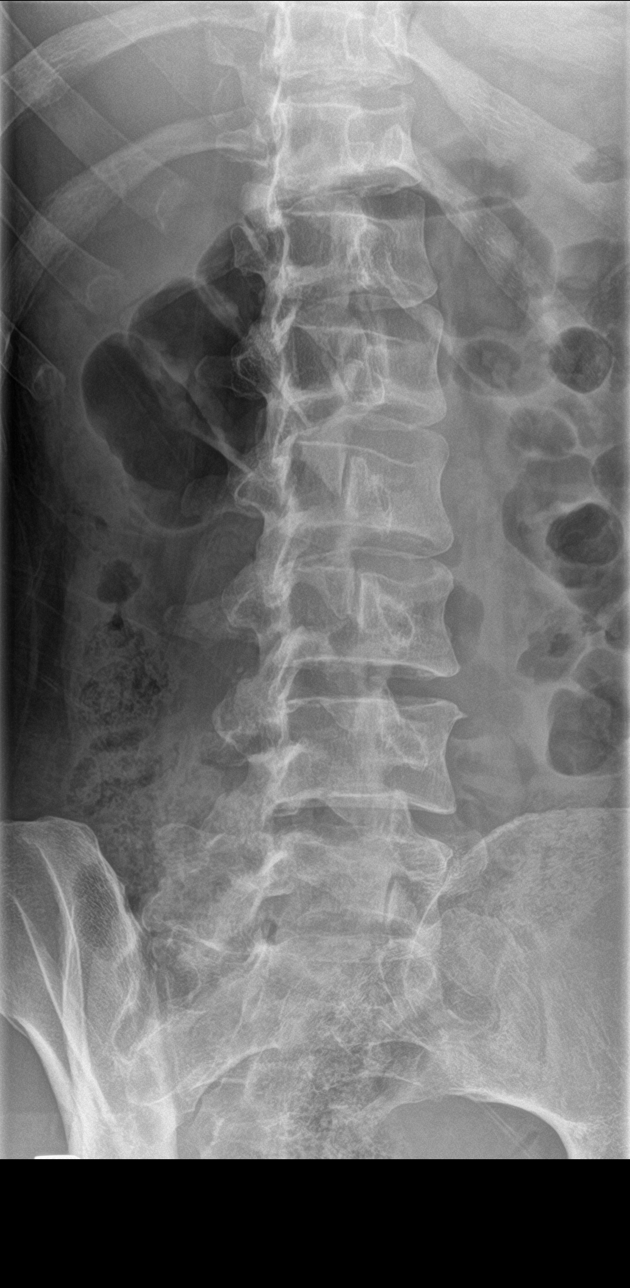
[im 4/5]
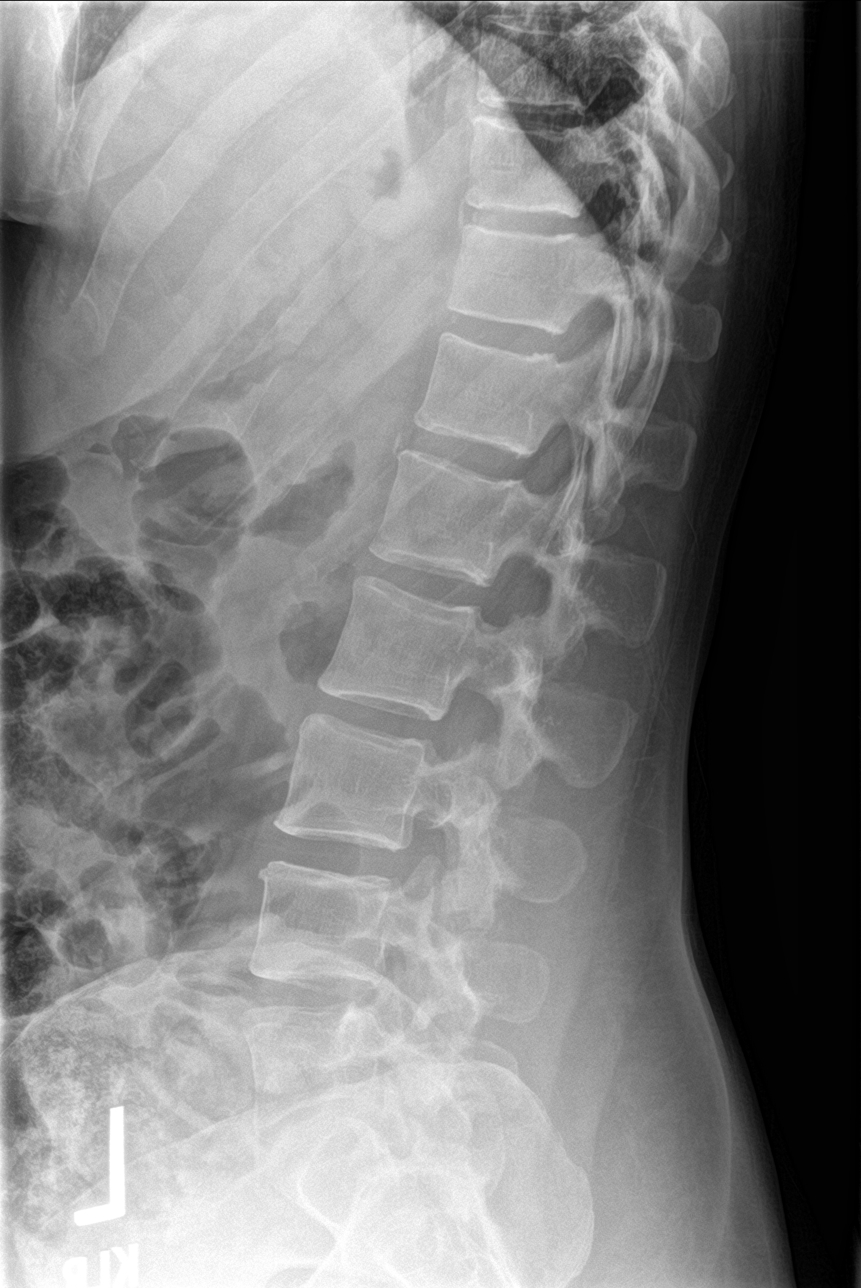
[im 5/5]
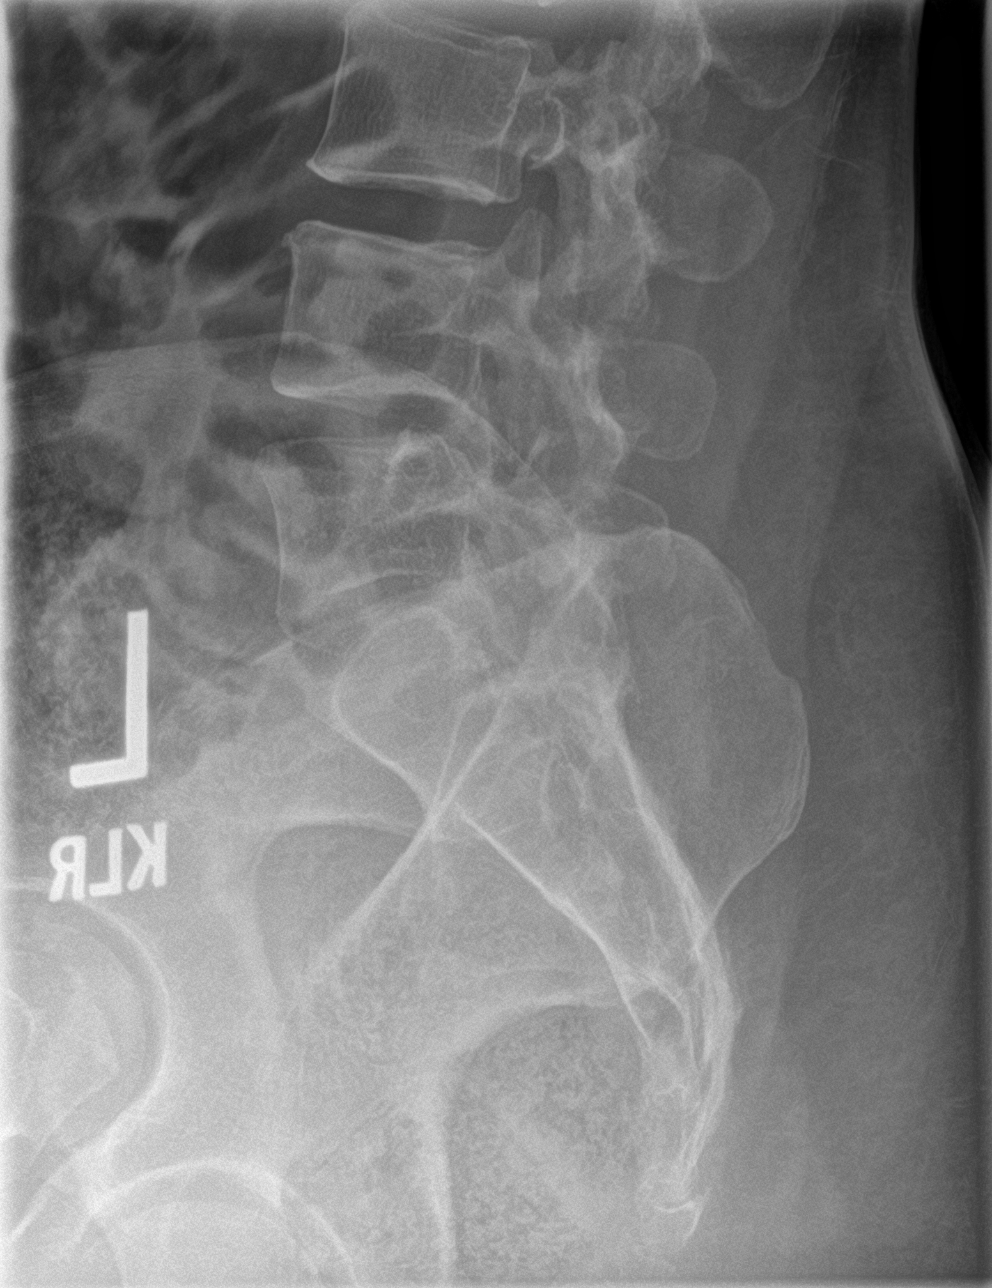

[5 of 5 positions shown; findings below may reference images not displayed]

FINDINGS: Transitional lumbosacral anatomy with partial sacralization of L5
with a right-sided assimilation joint. There is also L5 spina bifida
occulta. No fracture or listhesis. Vertebral body heights and
intervertebral disc spaces are maintained.
IMPRESSION: No fracture or static subluxation of the lumbar spine.

## 2018-11-20 ENCOUNTER — Telehealth: Payer: Self-pay | Admitting: Physician Assistant

## 2018-11-26 NOTE — Progress Notes (Signed)
Patient: Haley Moore Female    DOB: 1976/02/04   43 y.o.   MRN: [id number] Visit Date: 11/26/2018  Today's Provider: Mar Daring, PA-C   No chief complaint on file.  Subjective:  Virtual Visit via Video Note  I connected with Haley Moore on 11/26/18 at  1:20 PM EDT by a video enabled telemedicine application and verified that I am speaking with the correct person using two identifiers.  Location: Patient: Home Provider: [facility]   I discussed the limitations of evaluation and management by telemedicine and the availability of in person appointments. The patient expressed understanding and agreed to proceed.   HPI  PMDD:Patient seeing provider for 4- 6 weeks follow-up.Patient has failed Nortriptyline and sertraline and was prescribed fluoxetine to try. Clonazepam x 1 month for prn use while fluoxetine is titrated up. She also has failed multiple hormonal interventions with her GYN. She does not tolerate them and had an incident with an IUD moving and refuses to get another.   She reports that her symptoms of emotional lability during her menstrual cycle and before her cycle have drastically improved.    Allergies  Allergen Reactions  . Shellfish Allergy      Current Outpatient Medications:  .  cetirizine (ZYRTEC) 10 MG tablet, Take 10 mg by mouth as needed for allergies., Disp: , Rfl:  .  Cholecalciferol (VITAMIN D-1000 MAX ST) 1000 units tablet, Take 1 tablet by mouth daily., Disp: , Rfl:  .  clonazePAM (KLONOPIN) 0.5 MG tablet, Take 1 tablet (0.5 mg total) by mouth 2 (two) times daily as needed for anxiety., Disp: 60 tablet, Rfl: 0 .  COLLAGEN PO, Take 1 tablet by mouth daily., Disp: , Rfl:  .  EPIPEN 2-PAK 0.3 MG/0.3ML SOAJ injection, , Disp: , Rfl: 0 .  FLUoxetine (PROZAC) 20 MG capsule, TAKE 1 CAPSULE BY MOUTH EVERY DAY, Disp: 90 capsule, Rfl: 2 .  furosemide (LASIX) 20 MG tablet, TAKE 1 TABLET BY MOUTH AS NEEDED FOR LEG SWELLING, Disp: 90 tablet,  Rfl: 0 .  ibuprofen (ADVIL,MOTRIN) 600 MG tablet, TAKE 1 TABLET BY MOUTH EVERY 6 HOURS IF NEEDED, Disp: , Rfl: 0 .  Multiple Vitamin (MULTIVITAMIN) tablet, Take 1 tablet by mouth daily., Disp: , Rfl:  .  Nutritional Supplements (DHEA PO), Take by mouth., Disp: , Rfl:  .  omega-3 fish oil (MAXEPA) 1000 MG CAPS capsule, Take 1 capsule by mouth daily., Disp: , Rfl:   Review of Systems  Constitutional: Negative.   Respiratory: Negative.   Cardiovascular: Negative.   Gastrointestinal: Negative.   Genitourinary: Positive for menstrual problem.  Psychiatric/Behavioral: Negative.     Social History   Tobacco Use  . Smoking status: Former Smoker    Types: Cigarettes    Quit date: 02/07/1998    Years since quitting: 20.8  . Smokeless tobacco: Never Used  . Tobacco comment: 1 PACK WEEKLY  Substance Use Topics  . Alcohol use: Yes    Alcohol/week: 0.0 standard drinks    Comment: RARE      Objective:   There were no vitals taken for this visit. There were no vitals filed for this visit.There is no height or weight on file to calculate BMI.   Physical Exam Vitals signs reviewed.  Constitutional:      General: She is not in acute distress.    Appearance: Normal appearance. She is not ill-appearing.  Pulmonary:     Effort: Pulmonary effort is normal.  Neurological:  Mental Status: She is alert.  Psychiatric:        Mood and Affect: Mood normal.        Behavior: Behavior normal.        Thought Content: Thought content normal.        Judgment: Judgment normal.      No results found for any visits on 11/27/18.     Assessment & Plan     1. PMDD (premenstrual dysphoric disorder) Much improved. Continue medical treatment as below.  - clonazePAM (KLONOPIN) 0.5 MG tablet; Take 1 tablet (0.5 mg total) by mouth 2 (two) times daily as needed for anxiety.  Dispense: 60 tablet; Refill: 5 - FLUoxetine (PROZAC) 20 MG capsule; TAKE 1 CAPSULE BY MOUTH EVERY DAY  Dispense: 90 capsule;  Refill: 1  2. Situational anxiety See above medical treatment plan. - clonazePAM (KLONOPIN) 0.5 MG tablet; Take 1 tablet (0.5 mg total) by mouth 2 (two) times daily as needed for anxiety.  Dispense: 60 tablet; Refill: 5 - FLUoxetine (PROZAC) 20 MG capsule; TAKE 1 CAPSULE BY MOUTH EVERY DAY  Dispense: 90 capsule; Refill: 1  3. Edema, unspecified type Using prn. Does help when she needs it.  - furosemide (LASIX) 20 MG tablet; TAKE 1 TABLET BY MOUTH AS NEEDED FOR LEG SWELLING  Dispense: 90 tablet; Refill: 3   I discussed the assessment and treatment plan with the patient. The patient was provided an opportunity to ask questions and all were answered. The patient agreed with the plan and demonstrated an understanding of the instructions.   The patient was advised to call back or seek an in-person evaluation if the symptoms worsen or if the condition fails to improve as anticipated.  I provided 12 minutes of non-face-to-face time during this encounter.    Mar Daring, PA-C  [facility]

## 2018-11-27 ENCOUNTER — Other Ambulatory Visit: Payer: Self-pay

## 2018-11-27 ENCOUNTER — Encounter: Payer: Self-pay | Admitting: Physician Assistant

## 2018-11-27 ENCOUNTER — Telehealth (INDEPENDENT_AMBULATORY_CARE_PROVIDER_SITE_OTHER): Payer: Self-pay | Admitting: Physician Assistant

## 2018-11-27 DIAGNOSIS — F418 Other specified anxiety disorders: Secondary | ICD-10-CM

## 2018-11-27 DIAGNOSIS — R609 Edema, unspecified: Secondary | ICD-10-CM

## 2018-11-27 DIAGNOSIS — F3281 Premenstrual dysphoric disorder: Secondary | ICD-10-CM

## 2018-11-27 MED ORDER — FUROSEMIDE 20 MG PO TABS: ORAL_TABLET | ORAL | 3 refills | Status: DC

## 2018-11-27 MED ORDER — FLUOXETINE HCL 20 MG PO CAPS: ORAL_CAPSULE | ORAL | 1 refills | Status: DC

## 2018-11-27 MED ORDER — CLONAZEPAM 0.5 MG PO TABS: 0.5000 mg | ORAL_TABLET | Freq: Two times a day (BID) | ORAL | 5 refills | Status: DC | PRN

## 2018-12-29 ENCOUNTER — Encounter: Payer: Self-pay | Admitting: Emergency Medicine

## 2018-12-29 ENCOUNTER — Ambulatory Visit
Admission: EM | Admit: 2018-12-29 | Discharge: 2018-12-29 | Disposition: A | Discharge status: Home / Self Care | Payer: Self-pay | Attending: Family Medicine | Admitting: Family Medicine

## 2018-12-29 ENCOUNTER — Other Ambulatory Visit: Payer: Self-pay

## 2018-12-29 DIAGNOSIS — G43709 Chronic migraine without aura, not intractable, without status migrainosus: Secondary | ICD-10-CM

## 2018-12-29 MED ORDER — ONDANSETRON HCL 4 MG PO TABS: 4.0000 mg | ORAL_TABLET | Freq: Three times a day (TID) | ORAL | 0 refills | Status: DC | PRN

## 2018-12-29 MED ORDER — SUMATRIPTAN SUCCINATE 50 MG PO TABS: 50.0000 mg | ORAL_TABLET | Freq: Once | ORAL | 0 refills | Status: DC

## 2018-12-29 MED ORDER — KETOROLAC TROMETHAMINE 60 MG/2ML IM SOLN
60.0000 mg | Freq: Once | INTRAMUSCULAR | Status: AC
  Administered 2018-12-29: 60 mg via INTRAMUSCULAR

## 2018-12-29 MED ORDER — ONDANSETRON 8 MG PO TBDP
8.0000 mg | ORAL_TABLET | Freq: Once | ORAL | Status: AC
  Administered 2018-12-29: 09:00:00 8 mg via ORAL

## 2018-12-29 NOTE — ED Provider Notes (Signed)
[facility]    CSN: [id number] Arrival date & time: 12/29/18  [id number]  History   Chief Complaint Chief Complaint  Patient presents with   Headache   HPI  43 year old female presents with migraine headache.  Patient has a history of migraine.  She reports that she has not had a migraine in quite some time.  She states that she started a new medication recently and believes that this may be an inciting factor.  She reports that her headache started yesterday.  Located in the right temporal region.  Associated nausea, photophobia, and phonophobia.  She has taken ibuprofen without resolution.  No fever.  No respiratory symptoms.  No relieving factors.  No other complaints.  PMH, Surgical Hx, Family Hx, Social History reviewed and updated as below.  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 12/08/2014   History of chicken pox 12/08/2014   History of colonic polyps 12/08/2014   Genital warts 12/08/2014   Premenstrual tension syndrome 12/08/2014   Dysmenorrhea 07/16/2014   Menorrhagia 07/16/2014   Allergic rhinitis 01/20/2011   Migraine headache without aura     Past Surgical History:  Procedure Laterality Date   BREAST ENHANCEMENT SURGERY     LAPAROSCOPIC TUBAL LIGATION N/A 10/25/2016   Procedure: LAPAROSCOPIC TUBAL LIGATION;  Surgeon: Malachy Mood, MD;  Location: [facility] ORS;  Service: Gynecology;  Laterality: N/A;   SKIN CANCER EXCISION     WRIST SURGERY      OB History    Gravida  0   Para  0   Term  0   Preterm  0   AB  0   Living        SAB  0   TAB  0   Ectopic  0   Multiple      Live Births               Home Medications     Prior to Admission medications   Medication Sig Start Date End Date Taking? Authorizing Provider  Cholecalciferol (VITAMIN D-1000 MAX ST) 1000 units tablet Take 1 tablet by mouth daily.   Yes [provider]  clonazePAM (KLONOPIN) 0.5 MG tablet Take 1 tablet (0.5 mg total) by mouth 2 (two) times daily as needed for anxiety. 11/27/18  Yes Mar Daring, PA-C  COLLAGEN PO Take 1 tablet by mouth daily.   Yes [provider]  FLUoxetine (PROZAC) 20 MG capsule TAKE 1 CAPSULE BY MOUTH EVERY DAY 11/27/18  Yes Fenton Malling M, PA-C  furosemide (LASIX) 20 MG tablet TAKE 1 TABLET BY MOUTH AS NEEDED FOR LEG SWELLING 11/27/18  Yes Fenton Malling M, PA-C  ibuprofen (ADVIL,MOTRIN) 600 MG tablet TAKE 1 TABLET BY MOUTH EVERY 6 HOURS IF NEEDED 10/30/16  Yes [provider]  Multiple Vitamin (MULTIVITAMIN) tablet Take 1 tablet by mouth daily.   Yes [provider]  omega-3 fish oil (MAXEPA) 1000 MG CAPS capsule Take 1 capsule by mouth daily.   Yes [provider]  EPIPEN 2-PAK 0.3 MG/0.3ML SOAJ injection  08/21/14   [provider]  Nutritional Supplements (DHEA PO) Take by mouth.  [provider]  ondansetron (ZOFRAN) 4 MG tablet Take 1 tablet (4 mg total) by mouth every 8 (eight) hours as needed for nausea or vomiting. 12/29/18   Coral Spikes, DO  SUMAtriptan (IMITREX) 50 MG tablet Take 1 tablet (50 mg total) by mouth once for 1 dose. May repeat in 2 hours if headache persists or recurs. 12/29/18 12/29/18  Coral Spikes, DO    Family History Family History  Problem Relation Age of Onset   Heart disease Mother    Uterine cancer Maternal Grandmother 44       vs ovar ca    Social History Social History   Tobacco Use   Smoking status: Former Smoker    Types: Cigarettes    Quit date: 02/07/1998    Years since quitting: 20.9   Smokeless tobacco: Never Used   Tobacco comment: 1 PACK WEEKLY  Substance Use Topics    Alcohol use: Yes    Alcohol/week: 0.0 standard drinks    Comment: RARE   Drug use: No     Allergies   Shellfish allergy   Review of Systems Review of Systems  HENT:       Phonophobia.  Eyes: Positive for photophobia.  Gastrointestinal: Positive for nausea.  Neurological: Positive for headaches.   Physical Exam Triage Vital Signs ED Triage Vitals  Enc Vitals Group     BP 12/29/18 0855 120/81     Pulse Rate 12/29/18 0855 68     Resp 12/29/18 0855 16     Temp 12/29/18 0855 98.2 F (36.8 C)     Temp Source 12/29/18 0855 Oral     SpO2 12/29/18 0855 99 %     Weight 12/29/18 0851 195 lb (88.5 kg)     Height 12/29/18 0851 5\' 4"  (1.626 m)     Head Circumference --      Peak Flow --      Pain Score 12/29/18 0851 8     Pain Loc --      Pain Edu? --      Excl. in [place]? --    Updated Vital Signs BP 120/81 (BP Location: Right Arm)    Pulse 68    Temp 98.2 F (36.8 C) (Oral)    Resp 16    Ht 5\' 4"  (1.626 m)    Wt 88.5 kg    LMP 12/19/2018 (Approximate)    SpO2 99%    BMI 33.47 kg/m   Visual Acuity Right Eye Distance:   Left Eye Distance:   Bilateral Distance:    Right Eye Near:   Left Eye Near:    Bilateral Near:     Physical Exam Vitals signs and nursing note reviewed.  Constitutional:      General: She is not in acute distress.    Appearance: Normal appearance. She is not ill-appearing.  HENT:     Head: Normocephalic and atraumatic.  Eyes:     General:        Right eye: No discharge.        Left eye: No discharge.     Conjunctiva/sclera: Conjunctivae normal.     Pupils: Pupils are equal, round, and reactive to light.  Cardiovascular:     Rate and Rhythm: Normal rate and regular rhythm.     Heart sounds: No murmur.  Pulmonary:     Effort: Pulmonary effort is normal.     Breath sounds: Normal breath sounds. No wheezing, rhonchi or rales.  Neurological:  Mental Status: She is alert.  Psychiatric:        Mood and Affect: Mood normal.        Behavior:  Behavior normal.    UC Treatments / Results  Labs (all labs ordered are listed, but only abnormal results are displayed) Labs Reviewed - No data to display  EKG   Radiology No results found.  Procedures Procedures (including critical care time)  Medications Ordered in UC Medications  ketorolac (TORADOL) injection 60 mg (60 mg Intramuscular Given 12/29/18 0907)  ondansetron (ZOFRAN-ODT) disintegrating tablet 8 mg (8 mg Oral Given 12/29/18 0907)    Initial Impression / Assessment and Plan / UC Course  I have reviewed the triage vital signs and the nursing notes.  Pertinent labs & imaging results that were available during my care of the patient were reviewed by me and considered in my medical decision making (see chart for details).    43 year old female presents with migraine headache.  Treating with Toradol and Zofran.  Sending home on Imitrex and Zofran.  Supportive care.  Final Clinical Impressions(s) / UC Diagnoses   Final diagnoses:  Chronic migraine without aura without status migrainosus, not intractable   Discharge Instructions   None    ED Prescriptions    Medication Sig Dispense Auth. Provider   SUMAtriptan (IMITREX) 50 MG tablet  (Status: Discontinued) Take 1 tablet (50 mg total) by mouth once for 1 dose. May repeat in 2 hours if headache persists or recurs. 10 tablet Erienne Spelman G, DO   ondansetron (ZOFRAN) 4 MG tablet  (Status: Discontinued) Take 1 tablet (4 mg total) by mouth every 8 (eight) hours as needed for nausea or vomiting. 20 tablet Loany Neuroth G, DO   ondansetron (ZOFRAN) 4 MG tablet Take 1 tablet (4 mg total) by mouth every 8 (eight) hours as needed for nausea or vomiting. 20 tablet Ahmiya Abee G, DO   SUMAtriptan (IMITREX) 50 MG tablet Take 1 tablet (50 mg total) by mouth once for 1 dose. May repeat in 2 hours if headache persists or recurs. 10 tablet Coral Spikes, DO     PDMP not reviewed this encounter.   Thersa Salt [place], [place] 12/29/18  [phone]

## 2018-12-29 NOTE — ED Triage Notes (Signed)
Patient c/o migraine headache that started yesterday.  Patient reports light and sound sensitivity.  Patient reports nausea.  Patient reports ibuprofen yesterday.  Patient denies cold symptoms.  Patient denies fevers.

## 2019-01-10 ENCOUNTER — Encounter: Payer: Self-pay | Admitting: Physician Assistant

## 2019-01-10 NOTE — Progress Notes (Signed)
Patient: Haley Moore Female    DOB: 1975/05/27   43 y.o.   MRN: [id number] Visit Date: 01/11/2019  Today's Provider: Mar Daring, PA-C   Chief Complaint  Patient presents with  . Fatigue   Subjective:    I,Joseline E. Rosas,RMA am acting as a Education administrator for [employer], PA-C.   Virtual Visit via Video Note  I connected with Haley Moore on 01/11/19 at 11:20 AM EST by a video enabled telemedicine application and verified that I am speaking with the correct person using two identifiers.  Location: Patient: Home Provider: [facility]   I discussed the limitations of evaluation and management by telemedicine and the availability of in person appointments. The patient expressed understanding and agreed to proceed.   HPI  Patient has been having sweats for the past month constantly with fatigue and poor appetite with the headaches and feels like these are side effects from the fluoxetine 20mg . She has been on this medication since Sept 2020. Over the last month she has had worsening sweats, fatigue and headaches. She is also having some back and neck pain/muscle tenderness. Fluoxetine also caused her to be more emotionless and flat.    Allergies  Allergen Reactions  . Shellfish Allergy      Current Outpatient Medications:  .  Cholecalciferol (VITAMIN D-1000 MAX ST) 1000 units tablet, Take 1 tablet by mouth daily., Disp: , Rfl:  .  clonazePAM (KLONOPIN) 0.5 MG tablet, Take 1 tablet (0.5 mg total) by mouth 2 (two) times daily as needed for anxiety., Disp: 60 tablet, Rfl: 5 .  COLLAGEN PO, Take 1 tablet by mouth daily., Disp: , Rfl:  .  FLUoxetine (PROZAC) 20 MG capsule, TAKE 1 CAPSULE BY MOUTH EVERY DAY, Disp: 90 capsule, Rfl: 1 .  furosemide (LASIX) 20 MG tablet, TAKE 1 TABLET BY MOUTH AS NEEDED FOR LEG SWELLING, Disp: 90 tablet, Rfl: 3 .  ibuprofen (ADVIL,MOTRIN) 600 MG tablet, TAKE 1 TABLET BY MOUTH EVERY 6 HOURS IF NEEDED, Disp: , Rfl: 0 .  Multiple  Vitamin (MULTIVITAMIN) tablet, Take 1 tablet by mouth daily., Disp: , Rfl:  .  Nutritional Supplements (DHEA PO), Take by mouth., Disp: , Rfl:  .  ondansetron (ZOFRAN) 4 MG tablet, Take 1 tablet (4 mg total) by mouth every 8 (eight) hours as needed for nausea or vomiting., Disp: 20 tablet, Rfl: 0 .  SUMAtriptan (IMITREX) 50 MG tablet, Take 1 tablet (50 mg total) by mouth once for 1 dose. May repeat in 2 hours if headache persists or recurs., Disp: 10 tablet, Rfl: 0 .  EPIPEN 2-PAK 0.3 MG/0.3ML SOAJ injection, , Disp: , Rfl: 0  Review of Systems  Constitutional: Positive for appetite change, diaphoresis and fatigue. Negative for unexpected weight change.  HENT: Negative.   Respiratory: Negative.   Cardiovascular: Negative.   Gastrointestinal: Negative.   Psychiatric/Behavioral: Positive for dysphoric mood (flat).    Social History   Tobacco Use  . Smoking status: Former Smoker    Types: Cigarettes    Quit date: 02/07/1998    Years since quitting: 20.9  . Smokeless tobacco: Never Used  . Tobacco comment: 1 PACK WEEKLY  Substance Use Topics  . Alcohol use: Yes    Alcohol/week: 0.0 standard drinks    Comment: RARE      Objective:   LMP 12/19/2018 (Approximate)  There were no vitals filed for this visit.There is no height or weight on file to calculate BMI.  Physical Exam Vitals signs reviewed.  Constitutional:      General: She is not in acute distress.    Appearance: Normal appearance. She is well-developed. She is not ill-appearing.  HENT:     Head: Normocephalic and atraumatic.  Neck:     Musculoskeletal: Normal range of motion and neck supple.  Pulmonary:     Effort: Pulmonary effort is normal. No respiratory distress.  Neurological:     Mental Status: She is alert.  Psychiatric:        Mood and Affect: Mood normal.        Behavior: Behavior normal.        Thought Content: Thought content normal.        Judgment: Judgment normal.      No results found for any  visits on 01/11/19.     Assessment & Plan     1. PMDD (premenstrual dysphoric disorder) Had previously been on Sertraline but it had become ineffective. Tried Fluoxetine over 3 months, but started developing sweating episodes, increasing fatigue, and flat affect. She is also developing worsening headaches, back pain, neck pain and myalgias. Will try Cymbalta as below. I will f/u with her in 4-6 weeks. Call if side effects occur.  - DULoxetine (CYMBALTA) 30 MG capsule; Take 1 capsule (30 mg total) by mouth daily.  Dispense: 90 capsule; Refill: 0  2. Premenstrual tension syndrome See above medical treatment plan. - DULoxetine (CYMBALTA) 30 MG capsule; Take 1 capsule (30 mg total) by mouth daily.  Dispense: 90 capsule; Refill: 0   I discussed the assessment and treatment plan with the patient. The patient was provided an opportunity to ask questions and all were answered. The patient agreed with the plan and demonstrated an understanding of the instructions.   The patient was advised to call back or seek an in-person evaluation if the symptoms worsen or if the condition fails to improve as anticipated.  I provided 12 minutes of non-face-to-face time during this encounter.    Mar Daring, PA-C  [facility]

## 2019-01-11 ENCOUNTER — Telehealth (INDEPENDENT_AMBULATORY_CARE_PROVIDER_SITE_OTHER): Payer: Self-pay | Admitting: Physician Assistant

## 2019-01-11 ENCOUNTER — Encounter: Payer: Self-pay | Admitting: Physician Assistant

## 2019-01-11 DIAGNOSIS — N943 Premenstrual tension syndrome: Secondary | ICD-10-CM

## 2019-01-11 DIAGNOSIS — F3281 Premenstrual dysphoric disorder: Secondary | ICD-10-CM

## 2019-01-11 MED ORDER — DULOXETINE HCL 30 MG PO CPEP: 30.0000 mg | ORAL_CAPSULE | Freq: Every day | ORAL | 0 refills | Status: DC

## 2019-01-11 NOTE — Patient Instructions (Signed)
Duloxetine delayed-release capsules What is this medicine? DULOXETINE (doo LOX e teen) is used to treat depression, anxiety, and different types of chronic pain. This medicine may be used for other purposes; ask your health care provider or pharmacist if you have questions. COMMON BRAND NAME(S): Cymbalta, Drizalma, Irenka What should I tell my health care provider before I take this medicine? They need to know if you have any of these conditions:  bipolar disorder  glaucoma  high blood pressure  kidney disease  liver disease  seizures  suicidal thoughts, plans or attempt; a previous suicide attempt by you or a family member  take medicines that treat or prevent blood clots  taken medicines called MAOIs like Carbex, Eldepryl, Marplan, Nardil, and Parnate within 14 days  trouble passing urine  an unusual reaction to duloxetine, other medicines, foods, dyes, or preservatives  pregnant or trying to get pregnant  breast-feeding How should I use this medicine? Take this medicine by mouth with a glass of water. Follow the directions on the prescription label. Do not crush, cut or chew some capsules of this medicine. Some capsules may be opened and sprinkled on applesauce. Check with your doctor or pharmacist if you are not sure. You can take this medicine with or without food. Take your medicine at regular intervals. Do not take your medicine more often than directed. Do not stop taking this medicine suddenly except upon the advice of your doctor. Stopping this medicine too quickly may cause serious side effects or your condition may worsen. A special MedGuide will be given to you by the pharmacist with each prescription and refill. Be sure to read this information carefully each time. Talk to your pediatrician regarding the use of this medicine in children. While this drug may be prescribed for children as young as 7 years of age for selected conditions, precautions do  apply. Overdosage: If you think you have taken too much of this medicine contact a poison control center or emergency room at once. NOTE: This medicine is only for you. Do not share this medicine with others. What if I miss a dose? If you miss a dose, take it as soon as you can. If it is almost time for your next dose, take only that dose. Do not take double or extra doses. What may interact with this medicine? Do not take this medicine with any of the following medications:  desvenlafaxine  levomilnacipran  linezolid  MAOIs like Carbex, Eldepryl, Marplan, Nardil, and Parnate  methylene blue (injected into a vein)  milnacipran  thioridazine  venlafaxine This medicine may also interact with the following medications:  alcohol  amphetamines  aspirin and aspirin-like medicines  certain antibiotics like ciprofloxacin and enoxacin  certain medicines for blood pressure, heart disease, irregular heart beat  certain medicines for depression, anxiety, or psychotic disturbances  certain medicines for migraine headache like almotriptan, eletriptan, frovatriptan, naratriptan, rizatriptan, sumatriptan, zolmitriptan  certain medicines that treat or prevent blood clots like warfarin, enoxaparin, and dalteparin  cimetidine  fentanyl  lithium  NSAIDS, medicines for pain and inflammation, like ibuprofen or naproxen  phentermine  procarbazine  rasagiline  sibutramine  St. John's wort  theophylline  tramadol  tryptophan This list may not describe all possible interactions. Give your health care provider a list of all the medicines, herbs, non-prescription drugs, or dietary supplements you use. Also tell them if you smoke, drink alcohol, or use illegal drugs. Some items may interact with your medicine. What should I watch for while   using this medicine? Tell your doctor if your symptoms do not get better or if they get worse. Visit your doctor or healthcare provider for  regular checks on your progress. Because it may take several weeks to see the full effects of this medicine, it is important to continue your treatment as prescribed by your doctor. This medicine may cause serious skin reactions. They can happen weeks to months after starting the medicine. Contact your healthcare provider right away if you notice fevers or flu-like symptoms with a rash. The rash may be red or purple and then turn into blisters or peeling of the skin. Or, you might notice a red rash with swelling of the face, lips, or lymph nodes in your neck or under your arms. Patients and their families should watch out for new or worsening thoughts of suicide or depression. Also watch out for sudden changes in feelings such as feeling anxious, agitated, panicky, irritable, hostile, aggressive, impulsive, severely restless, overly excited and hyperactive, or not being able to sleep. If this happens, especially at the beginning of treatment or after a change in dose, call your healthcare provider. You may get drowsy or dizzy. Do not drive, use machinery, or do anything that needs mental alertness until you know how this medicine affects you. Do not stand or sit up quickly, especially if you are an older patient. This reduces the risk of dizzy or fainting spells. Alcohol may interfere with the effect of this medicine. Avoid alcoholic drinks. This medicine can cause an increase in blood pressure. This medicine can also cause a sudden drop in your blood pressure, which may make you feel faint and increase the chance of a fall. These effects are most common when you first start the medicine or when the dose is increased, or during use of other medicines that can cause a sudden drop in blood pressure. Check with your doctor for instructions on monitoring your blood pressure while taking this medicine. Your mouth may get dry. Chewing sugarless gum or sucking hard candy, and drinking plenty of water, may help.  Contact your doctor if the problem does not go away or is severe. What side effects may I notice from receiving this medicine? Side effects that you should report to your doctor or health care professional as soon as possible:  allergic reactions like skin rash, itching or hives, swelling of the face, lips, or tongue  anxious  breathing problems  confusion  changes in vision  chest pain  confusion  elevated mood, decreased need for sleep, racing thoughts, impulsive behavior  eye pain  fast, irregular heartbeat  feeling faint or lightheaded, falls  feeling agitated, angry, or irritable  hallucination, loss of contact with reality  high blood pressure  loss of balance or coordination  palpitations  redness, blistering, peeling or loosening of the skin, including inside the mouth  restlessness, pacing, inability to keep still  seizures  stiff muscles  suicidal thoughts or other mood changes  trouble passing urine or change in the amount of urine  trouble sleeping  unusual bleeding or bruising  unusually weak or tired  vomiting  yellowing of the eyes or skin Side effects that usually do not require medical attention (report to your doctor or health care professional if they continue or are bothersome):  change in sex drive or performance  change in appetite or weight  constipation  dizziness  dry mouth  headache  increased sweating  nausea  tired This list may not describe   all possible side effects. Call your doctor for medical advice about side effects. You may report side effects to FDA at 1-800-FDA-1088. Where should I keep my medicine? Keep out of the reach of children. Store at room temperature between 15 and 30 degrees C (59 to 86 degrees F). Throw away any unused medicine after the expiration date. NOTE: This sheet is a summary. It may not cover all possible information. If you have questions about this medicine, talk to your doctor,  pharmacist, or health care provider.  2020 Elsevier/Gold Standard (2018-04-26 13:47:50)  

## 2019-02-04 NOTE — Progress Notes (Signed)
Patient: Haley Moore Female    DOB: April 03, 1975   43 y.o.   MRN: [id number] Visit Date: 02/06/2019  Today's Provider: Mar Daring, PA-C   Chief Complaint  Patient presents with  . Follow-up   Subjective:     Virtual Visit via Video Note  I connected with Haley Moore on 02/06/19 at  8:20 AM EST by a video enabled telemedicine application and verified that I am speaking with the correct person using two identifiers.  Location: Patient: Home Provider: [facility]   I discussed the limitations of evaluation and management by telemedicine and the availability of in person appointments. The patient expressed understanding and agreed to proceed.   HPI   PMDD, Follow up:  The patient was last seen for PMDD 4-6 weeks ago. Changes made since that visit include previously been on Sertraline but it had become ineffective. Tried Fluoxetine over 3 months, but started developing sweating episodes, increasing fatigue, and flat affect.  She is also developing worsening headaches, back pain, neck pain and myalgias. Will try Cymbalta.  She reports good compliance with treatment, good tolerance and poor symptom control. She she feels that the Cymbalta has not helped to control symptoms at all. She has had 2 weeks of PMS symptoms since changing.   She is not having side effects.  Has now tried Nortriptyline (diarrhea), sertraline (no effect), fluoxetine (worked the best but had hot flashes), duloxetine (no effect).   ------------------------------------------------------------------------    Allergies  Allergen Reactions  . Shellfish Allergy      Current Outpatient Medications:  .  Cholecalciferol (VITAMIN D-1000 MAX ST) 1000 units tablet, Take 1 tablet by mouth daily., Disp: , Rfl:  .  clonazePAM (KLONOPIN) 0.5 MG tablet, Take 1 tablet (0.5 mg total) by mouth 2 (two) times daily as needed for anxiety., Disp: 60 tablet, Rfl: 5 .  COLLAGEN PO, Take 1 tablet by mouth  daily., Disp: , Rfl:  .  DULoxetine (CYMBALTA) 30 MG capsule, Take 1 capsule (30 mg total) by mouth daily., Disp: 90 capsule, Rfl: 0 .  EPIPEN 2-PAK 0.3 MG/0.3ML SOAJ injection, , Disp: , Rfl: 0 .  furosemide (LASIX) 20 MG tablet, TAKE 1 TABLET BY MOUTH AS NEEDED FOR LEG SWELLING, Disp: 90 tablet, Rfl: 3 .  ibuprofen (ADVIL,MOTRIN) 600 MG tablet, TAKE 1 TABLET BY MOUTH EVERY 6 HOURS IF NEEDED, Disp: , Rfl: 0 .  Nutritional Supplements (DHEA PO), Take by mouth., Disp: , Rfl:  .  ondansetron (ZOFRAN) 4 MG tablet, Take 1 tablet (4 mg total) by mouth every 8 (eight) hours as needed for nausea or vomiting., Disp: 20 tablet, Rfl: 0 .  FLUoxetine (PROZAC) 20 MG capsule, TAKE 1 CAPSULE BY MOUTH EVERY DAY (Patient not taking: Reported on 02/06/2019), Disp: 90 capsule, Rfl: 1 .  Multiple Vitamin (MULTIVITAMIN) tablet, Take 1 tablet by mouth daily., Disp: , Rfl:  .  SUMAtriptan (IMITREX) 50 MG tablet, Take 1 tablet (50 mg total) by mouth once for 1 dose. May repeat in 2 hours if headache persists or recurs., Disp: 10 tablet, Rfl: 0  Review of Systems  Constitutional: Positive for appetite change and diaphoresis. Negative for chills, fatigue and fever.  Respiratory: Negative for chest tightness and shortness of breath.   Cardiovascular: Negative for chest pain and palpitations.  Gastrointestinal: Negative for abdominal pain, nausea and vomiting.  Neurological: Negative for dizziness and weakness.  Psychiatric/Behavioral: Positive for agitation, decreased concentration and dysphoric mood.    Social  History   Tobacco Use  . Smoking status: Former Smoker    Types: Cigarettes    Quit date: 02/07/1998    Years since quitting: 21.0  . Smokeless tobacco: Never Used  . Tobacco comment: 1 PACK WEEKLY  Substance Use Topics  . Alcohol use: Yes    Alcohol/week: 0.0 standard drinks    Comment: RARE      Objective:   There were no vitals taken for this visit. There were no vitals filed for this visit.There  is no height or weight on file to calculate BMI.   Physical Exam Vitals reviewed.  Constitutional:      Appearance: Normal appearance. She is well-developed.  HENT:     Head: Normocephalic and atraumatic.  Pulmonary:     Effort: Pulmonary effort is normal. No respiratory distress.  Musculoskeletal:     Cervical back: Normal range of motion and neck supple.  Neurological:     Mental Status: She is alert.  Psychiatric:        Mood and Affect: Mood normal.        Behavior: Behavior normal.        Thought Content: Thought content normal.        Judgment: Judgment normal.      No results found for any visits on 02/06/19.     Assessment & Plan    1. PMDD (premenstrual dysphoric disorder) Failed Duloxetine. Will start escitalopram as below. I will f/u with her in 4 weeks. Call if side effects or other symptoms occur prior to next appt.  - escitalopram (LEXAPRO) 20 MG tablet; Take 1 tablet (20 mg total) by mouth at bedtime.  Dispense: 30 tablet; Refill: 1  2. Migraine with aura and without status migrainosus, not intractable Stable. Diagnosis pulled for medication refill. Continue current medical treatment plan. - SUMAtriptan (IMITREX) 50 MG tablet; Take 1 tablet (50 mg total) by mouth once for 1 dose. May repeat in 2 hours if headache persists or recurs.  Dispense: 10 tablet; Refill: 11  I discussed the assessment and treatment plan with the patient. The patient was provided an opportunity to ask questions and all were answered. The patient agreed with the plan and demonstrated an understanding of the instructions.   The patient was advised to call back or seek an in-person evaluation if the symptoms worsen or if the condition fails to improve as anticipated.  I provided 15 minutes of non-face-to-face time during this encounter.    Mar Daring, PA-C  [facility]

## 2019-02-06 ENCOUNTER — Telehealth (INDEPENDENT_AMBULATORY_CARE_PROVIDER_SITE_OTHER): Payer: Self-pay | Admitting: Physician Assistant

## 2019-02-06 ENCOUNTER — Encounter: Payer: Self-pay | Admitting: Physician Assistant

## 2019-02-06 DIAGNOSIS — F3281 Premenstrual dysphoric disorder: Secondary | ICD-10-CM

## 2019-02-06 DIAGNOSIS — G43109 Migraine with aura, not intractable, without status migrainosus: Secondary | ICD-10-CM

## 2019-02-06 MED ORDER — ESCITALOPRAM OXALATE 20 MG PO TABS: 20.0000 mg | ORAL_TABLET | Freq: Every day | ORAL | 1 refills | Status: DC

## 2019-02-06 MED ORDER — SUMATRIPTAN SUCCINATE 50 MG PO TABS: 50.0000 mg | ORAL_TABLET | Freq: Once | ORAL | 11 refills | Status: DC

## 2019-02-06 NOTE — Patient Instructions (Signed)
Escitalopram tablets What is this medicine? ESCITALOPRAM (es sye TAL oh pram) is used to treat depression and certain types of anxiety. This medicine may be used for other purposes; ask your health care provider or pharmacist if you have questions. COMMON BRAND NAME(S): Lexapro What should I tell my health care provider before I take this medicine? They need to know if you have any of these conditions:  bipolar disorder or a family history of bipolar disorder  diabetes  glaucoma  heart disease  kidney or liver disease  receiving electroconvulsive therapy  seizures (convulsions)  suicidal thoughts, plans, or attempt by you or a family member  an unusual or allergic reaction to escitalopram, the related drug citalopram, other medicines, foods, dyes, or preservatives  pregnant or trying to become pregnant  breast-feeding How should I use this medicine? Take this medicine by mouth with a glass of water. Follow the directions on the prescription label. You can take it with or without food. If it upsets your stomach, take it with food. Take your medicine at regular intervals. Do not take it more often than directed. Do not stop taking this medicine suddenly except upon the advice of your doctor. Stopping this medicine too quickly may cause serious side effects or your condition may worsen. A special MedGuide will be given to you by the pharmacist with each prescription and refill. Be sure to read this information carefully each time. Talk to your pediatrician regarding the use of this medicine in children. Special care may be needed. Overdosage: If you think you have taken too much of this medicine contact a poison control center or emergency room at once. NOTE: This medicine is only for you. Do not share this medicine with others. What if I miss a dose? If you miss a dose, take it as soon as you can. If it is almost time for your next dose, take only that dose. Do not take double or  extra doses. What may interact with this medicine? Do not take this medicine with any of the following medications:  certain medicines for fungal infections like fluconazole, itraconazole, ketoconazole, posaconazole, voriconazole  cisapride  citalopram  dronedarone  linezolid  MAOIs like Carbex, Eldepryl, Marplan, Nardil, and Parnate  methylene blue (injected into a vein)  pimozide  thioridazine This medicine may also interact with the following medications:  alcohol  amphetamines  aspirin and aspirin-like medicines  carbamazepine  certain medicines for depression, anxiety, or psychotic disturbances  certain medicines for migraine headache like almotriptan, eletriptan, frovatriptan, naratriptan, rizatriptan, sumatriptan, zolmitriptan  certain medicines for sleep  certain medicines that treat or prevent blood clots like warfarin, enoxaparin, dalteparin  cimetidine  diuretics  dofetilide  fentanyl  furazolidone  isoniazid  lithium  metoprolol  NSAIDs, medicines for pain and inflammation, like ibuprofen or naproxen  other medicines that prolong the QT interval (cause an abnormal heart rhythm)  procarbazine  rasagiline  supplements like St. John's wort, kava kava, valerian  tramadol  tryptophan  ziprasidone This list may not describe all possible interactions. Give your health care provider a list of all the medicines, herbs, non-prescription drugs, or dietary supplements you use. Also tell them if you smoke, drink alcohol, or use illegal drugs. Some items may interact with your medicine. What should I watch for while using this medicine? Tell your doctor if your symptoms do not get better or if they get worse. Visit your doctor or health care professional for regular checks on your progress. Because it may   take several weeks to see the full effects of this medicine, it is important to continue your treatment as prescribed by your doctor. Patients  and their families should watch out for new or worsening thoughts of suicide or depression. Also watch out for sudden changes in feelings such as feeling anxious, agitated, panicky, irritable, hostile, aggressive, impulsive, severely restless, overly excited and hyperactive, or not being able to sleep. If this happens, especially at the beginning of treatment or after a change in dose, call your health care professional. You may get drowsy or dizzy. Do not drive, use machinery, or do anything that needs mental alertness until you know how this medicine affects you. Do not stand or sit up quickly, especially if you are an older patient. This reduces the risk of dizzy or fainting spells. Alcohol may interfere with the effect of this medicine. Avoid alcoholic drinks. Your mouth may get dry. Chewing sugarless gum or sucking hard candy, and drinking plenty of water may help. Contact your doctor if the problem does not go away or is severe. What side effects may I notice from receiving this medicine? Side effects that you should report to your doctor or health care professional as soon as possible:  allergic reactions like skin rash, itching or hives, swelling of the face, lips, or tongue  anxious  black, tarry stools  changes in vision  confusion  elevated mood, decreased need for sleep, racing thoughts, impulsive behavior  eye pain  fast, irregular heartbeat  feeling faint or lightheaded, falls  feeling agitated, angry, or irritable  hallucination, loss of contact with reality  loss of balance or coordination  loss of memory  painful or prolonged erections  restlessness, pacing, inability to keep still  seizures  stiff muscles  suicidal thoughts or other mood changes  trouble sleeping  unusual bleeding or bruising  unusually weak or tired  vomiting Side effects that usually do not require medical attention (report to your doctor or health care professional if they  continue or are bothersome):  changes in appetite  change in sex drive or performance  headache  increased sweating  indigestion, nausea  tremors This list may not describe all possible side effects. Call your doctor for medical advice about side effects. You may report side effects to FDA at 1-800-FDA-1088. Where should I keep my medicine? Keep out of reach of children. Store at room temperature between 15 and 30 degrees C (59 and 86 degrees F). Throw away any unused medicine after the expiration date. NOTE: This sheet is a summary. It may not cover all possible information. If you have questions about this medicine, talk to your doctor, pharmacist, or health care provider.  2020 Elsevier/Gold Standard (2018-01-15 11:21:44)  

## 2019-02-14 ENCOUNTER — Telehealth: Payer: Self-pay | Admitting: Physician Assistant

## 2019-02-15 ENCOUNTER — Telehealth: Payer: Self-pay | Admitting: Physician Assistant

## 2019-02-21 ENCOUNTER — Other Ambulatory Visit: Payer: Self-pay | Admitting: Physician Assistant

## 2019-02-21 ENCOUNTER — Telehealth: Payer: Self-pay | Admitting: Physician Assistant

## 2019-02-21 DIAGNOSIS — F3281 Premenstrual dysphoric disorder: Secondary | ICD-10-CM

## 2019-02-21 DIAGNOSIS — F418 Other specified anxiety disorders: Secondary | ICD-10-CM

## 2019-02-21 MED ORDER — CLONAZEPAM 0.5 MG PO TABS: 0.5000 mg | ORAL_TABLET | Freq: Two times a day (BID) | ORAL | 5 refills | Status: DC | PRN

## 2019-02-21 MED ORDER — VENLAFAXINE HCL ER 75 MG PO TB24: 1.0000 | ORAL_TABLET | Freq: Every day | ORAL | 1 refills | Status: DC

## 2019-02-21 NOTE — Telephone Encounter (Signed)
Pt advised.  See phone message from [facility].   Thanks,   -Mickel Baas

## 2019-02-21 NOTE — Telephone Encounter (Signed)
LMTCB 02/21/2019.  Okay for [facility] to advise pt.  Thanks,   -Mickel Baas

## 2019-02-21 NOTE — Telephone Encounter (Signed)
Pt states that the escitalopram (LEXAPRO) 20 MG tablet she was given for PMDD is not working.  States she is extremely angry and emotional.  Pt states she is not a harm to herself or others at this time, but needs to speak with someone about getting this switched as soon as possible.

## 2019-02-21 NOTE — Telephone Encounter (Signed)
Pt called earlier today to ask for medication change, pt calling back to see if it was changed. Medication Lexapro was stopped and Effexor was called into [facility]. Pt also asking for refill on Klonopin, advised to call refill into [facility] and check on both Rx prior to going to pick up. Verbalized understanding.

## 2019-02-21 NOTE — Addendum Note (Signed)
Addended by: Ashley Royalty E on: 02/21/2019 11:48 AM   Modules accepted: Orders

## 2019-02-21 NOTE — Telephone Encounter (Signed)
Stop Lexapro and start venlafaxine (effexor). Sent to [facility]

## 2019-03-11 ENCOUNTER — Telehealth: Payer: Self-pay | Admitting: Physician Assistant

## 2019-03-11 DIAGNOSIS — F3281 Premenstrual dysphoric disorder: Secondary | ICD-10-CM

## 2019-03-11 MED ORDER — FLUOXETINE HCL 20 MG PO TABS: 20.0000 mg | ORAL_TABLET | Freq: Every day | ORAL | 3 refills | Status: DC

## 2019-03-11 NOTE — Telephone Encounter (Signed)
LM that Prozac was sent in

## 2019-03-11 NOTE — Telephone Encounter (Signed)
Patient advised as directed below. Per patient she just feels the medicine is not helping.She feels worst. She is not sure if increasing will help her. She feels like all the medication change is what is causing her to feel miserable. She reports that the Prozac work well for her and that the only reason why she stopped the prozac was because of the sweat and if she is not responding to the other medicines that she is willing to deal with the sweat.

## 2019-03-11 NOTE — Telephone Encounter (Signed)
Pt states it has been almost 3 weeks since she started a new anxiety med and she still feels like she is in a "funk".  Pt wants to know if she should wait a little longer, or has it been long enough that if med was going to work, it would have.

## 2019-03-11 NOTE — Telephone Encounter (Signed)
OK I will send in the prozac.

## 2019-03-11 NOTE — Telephone Encounter (Signed)
Is she tolerating the medication enough to want to try a higher dose, or it just isn't working at all and she wants to change?

## 2019-03-13 ENCOUNTER — Telehealth: Payer: Self-pay

## 2019-03-13 DIAGNOSIS — F3281 Premenstrual dysphoric disorder: Secondary | ICD-10-CM

## 2019-03-13 NOTE — Telephone Encounter (Signed)
Copied from [place] [id number]. Topic: General - Other >> Mar 13, 2019 10:45 AM Antonieta Iba C wrote: Reason for CRM: pharmacy called in to ask if Rx FLUoxetine (PROZAC) 20 MG tablet  be changed to capsules instead because they are less expensive  Please advise.

## 2019-03-14 MED ORDER — FLUOXETINE HCL 20 MG PO CAPS: 20.0000 mg | ORAL_CAPSULE | Freq: Every day | ORAL | 3 refills | Status: DC

## 2019-03-14 NOTE — Telephone Encounter (Signed)
Changed to capsules

## 2019-06-19 ENCOUNTER — Ambulatory Visit: Payer: Self-pay | Admitting: Obstetrics and Gynecology

## 2019-06-19 ENCOUNTER — Other Ambulatory Visit: Payer: Self-pay

## 2019-06-19 ENCOUNTER — Encounter: Payer: Self-pay | Admitting: Obstetrics and Gynecology

## 2019-06-19 VITALS — BP 100/70 | Ht 64.0 in | Wt 201.0 lb

## 2019-06-19 DIAGNOSIS — B373 Candidiasis of vulva and vagina: Secondary | ICD-10-CM

## 2019-06-19 DIAGNOSIS — B3731 Acute candidiasis of vulva and vagina: Secondary | ICD-10-CM

## 2019-06-19 DIAGNOSIS — R399 Unspecified symptoms and signs involving the genitourinary system: Secondary | ICD-10-CM

## 2019-06-19 LAB — POCT WET PREP WITH KOH
Clue Cells Wet Prep HPF POC: NEGATIVE
KOH Prep POC: NEGATIVE
Trichomonas, UA: NEGATIVE
Yeast Wet Prep HPF POC: POSITIVE

## 2019-06-19 LAB — POCT URINALYSIS DIPSTICK
Bilirubin, UA: NEGATIVE
Blood, UA: NEGATIVE
Glucose, UA: NEGATIVE
Ketones, UA: NEGATIVE
Nitrite, UA: NEGATIVE
Protein, UA: NEGATIVE
Spec Grav, UA: 1.02 (ref 1.010–1.025)
pH, UA: 6 (ref 5.0–8.0)

## 2019-06-19 MED ORDER — FLUCONAZOLE 150 MG PO TABS: 150.0000 mg | ORAL_TABLET | Freq: Once | ORAL | 0 refills | Status: AC

## 2019-06-19 MED ORDER — NITROFURANTOIN MONOHYD MACRO 100 MG PO CAPS: 100.0000 mg | ORAL_CAPSULE | Freq: Two times a day (BID) | ORAL | 0 refills | Status: AC

## 2019-06-19 NOTE — Patient Instructions (Signed)
I value your feedback and entrusting us with your care. If you get a [facility] patient survey, I would appreciate you taking the time to let us know about your experience today. Thank you!  As of January 17, 2019, your lab results will be released to your MyChart immediately, before I even have a chance to see them. Please give me time to review them and contact you if there are any abnormalities. Thank you for your patience.  

## 2019-06-19 NOTE — Addendum Note (Signed)
Addended by: Ardeth Perfect B on: [id number] 11:59 AM   Modules accepted: Orders

## 2019-06-19 NOTE — Progress Notes (Signed)
Mar Daring, PA-C   Chief Complaint  Patient presents with  . Vaginal Discharge    itching,irritation, no odor x 1 month  . Urinary Tract Infection    pelvic and back pain    HPI:      [name] is a 44 y.o. [id number] whose LMP was Patient's last menstrual period was 06/07/2019 (approximate)., presents today for vaginal itching/irritation/d/c for the past month, no fishy odor. Was on abx recently. No meds to treat. Wears thongs, too. No dryer sheets. Hx of chronic vaginaitis, treated with lotrisone crm in past, but these sx are different. Pt also with decreased urination, urge incont last wk (unusual for pt) with pelvic cramping/bloating recently. Has LBP, no fevers/chills. No hematuria, dysuria. No hx of UTIs.   Past Medical History:  Diagnosis Date  . Allergy   . Anxiety   . Depression   . Dysmenorrhea   . Malignant melanoma of skin of chest ([place])    2018  . Menorrhagia   . Migraine     Past Surgical History:  Procedure Laterality Date  . BREAST ENHANCEMENT SURGERY    . LAPAROSCOPIC TUBAL LIGATION N/A 10/25/2016   Procedure: LAPAROSCOPIC TUBAL LIGATION;  Surgeon: Malachy Mood, MD;  Location: [facility] ORS;  Service: Gynecology;  Laterality: N/A;  . SKIN CANCER EXCISION    . WRIST SURGERY      Family History  Problem Relation Age of Onset  . Heart disease Mother   . Uterine cancer Maternal Grandmother 50       vs ovar ca    Social History   Socioeconomic History  . Marital status: Married    Spouse name: Not on file  . Number of children: 0  . Years of education: Not on file  . Highest education level: Not on file  Occupational History  . Not on file  Tobacco Use  . Smoking status: Former Smoker    Types: Cigarettes    Quit date: 02/07/1998    Years since quitting: 21.3  . Smokeless tobacco: Never Used  . Tobacco comment: 1 PACK WEEKLY  Substance and Sexual Activity  . Alcohol use: Yes    Alcohol/week: 0.0 standard drinks   Comment: RARE  . Drug use: No  . Sexual activity: Yes    Birth control/protection: None, Surgical    Comment: Tubal ligation  Other Topics Concern  . Not on file  Social History Narrative   ** Merged History Encounter **       Social Determinants of Health   Financial Resource Strain:   . Difficulty of Paying Living Expenses:   Food Insecurity:   . Worried About Charity fundraiser in the Last Year:   . Arboriculturist in the Last Year:   Transportation Needs:   . Film/video editor (Medical):   Marland Kitchen Lack of Transportation (Non-Medical):   Physical Activity:   . Days of Exercise per Week:   . Minutes of Exercise per Session:   Stress:   . Feeling of Stress :   Social Connections:   . Frequency of Communication with Friends and Family:   . Frequency of Social Gatherings with Friends and Family:   . Attends Religious Services:   . Active Member of Clubs or Organizations:   . Attends Archivist Meetings:   Marland Kitchen Marital Status:   Intimate Partner Violence:   . Fear of Current or Ex-Partner:   . Emotionally Abused:   .  Physically Abused:   . Sexually Abused:     Outpatient Medications Prior to Visit  Medication Sig Dispense Refill  . Cholecalciferol (VITAMIN D-1000 MAX ST) 1000 units tablet Take 1 tablet by mouth daily.    . clonazePAM (KLONOPIN) 0.5 MG tablet Take 1 tablet (0.5 mg total) by mouth 2 (two) times daily as needed for anxiety. 60 tablet 5  . COLLAGEN PO Take 1 tablet by mouth daily.    Marland Kitchen FLUoxetine (PROZAC) 20 MG capsule Take 1 capsule (20 mg total) by mouth daily. 90 capsule 3  . furosemide (LASIX) 20 MG tablet TAKE 1 TABLET BY MOUTH AS NEEDED FOR LEG SWELLING 90 tablet 3  . ibuprofen (ADVIL,MOTRIN) 600 MG tablet TAKE 1 TABLET BY MOUTH EVERY 6 HOURS IF NEEDED  0  . Multiple Vitamin (MULTIVITAMIN) tablet Take 1 tablet by mouth daily.    . Nutritional Supplements (DHEA PO) Take by mouth.    . ondansetron (ZOFRAN) 4 MG tablet Take 1 tablet (4 mg total)  by mouth every 8 (eight) hours as needed for nausea or vomiting. 20 tablet 0  . SUMAtriptan (IMITREX) 50 MG tablet Take 1 tablet (50 mg total) by mouth once for 1 dose. May repeat in 2 hours if headache persists or recurs. 10 tablet 11  . EPIPEN 2-PAK 0.3 MG/0.3ML SOAJ injection   0   No facility-administered medications prior to visit.      ROS:  Review of Systems  Constitutional: Negative for fever.  Gastrointestinal: Negative for blood in stool, constipation, diarrhea, nausea and vomiting.  Genitourinary: Positive for difficulty urinating, dyspareunia, pelvic pain and vaginal discharge. Negative for dysuria, flank pain, frequency, hematuria, urgency, vaginal bleeding and vaginal pain.  Musculoskeletal: Negative for back pain.  Skin: Negative for rash.    OBJECTIVE:   Vitals:  BP 100/70   Ht 5\' 4"  (1.626 m)   Wt 201 lb (91.2 kg)   LMP 06/07/2019 (Approximate)   BMI 34.50 kg/m   Physical Exam Vitals reviewed.  Constitutional:      Appearance: She is well-developed.  Pulmonary:     Effort: Pulmonary effort is normal.  Abdominal:     Palpations: Abdomen is soft.     Tenderness: There is abdominal tenderness. There is no right CVA tenderness, left CVA tenderness, guarding or rebound.  Genitourinary:    General: Normal vulva.     Pubic Area: No rash.      Labia:        Right: No rash, tenderness or lesion.        Left: No rash, tenderness or lesion.      Vagina: Vaginal discharge present. No erythema or tenderness.     Cervix: Normal.     Uterus: Normal. Not enlarged and not tender.      Adnexa: Right adnexa normal and left adnexa normal.       Right: No mass or tenderness.         Left: No mass or tenderness.         Comments: THICK, CLUMPY WHITE VAG D/C Musculoskeletal:        General: Normal range of motion.     Cervical back: Normal range of motion.  Skin:    General: Skin is warm and dry.  Neurological:     General: No focal deficit present.     Mental  Status: She is alert and oriented to person, place, and time.  Psychiatric:        Mood and Affect: Mood  normal.        Behavior: Behavior normal.        Thought Content: Thought content normal.        Judgment: Judgment normal.     Results: Results for orders placed or performed in visit on 06/19/19 (from the past 24 hour(s))  POCT Wet Prep with KOH     Status: Abnormal   Collection Time: 06/19/19 11:56 AM  Result Value Ref Range   Trichomonas, UA Negative    Clue Cells Wet Prep HPF POC neg    Epithelial Wet Prep HPF POC     Yeast Wet Prep HPF POC pos    Bacteria Wet Prep HPF POC     RBC Wet Prep HPF POC     WBC Wet Prep HPF POC     KOH Prep POC Negative Negative  POCT Urinalysis Dipstick     Status: Abnormal   Collection Time: 06/19/19 11:56 AM  Result Value Ref Range   Color, UA yellow    Clarity, UA clear    Glucose, UA Negative Negative   Bilirubin, UA neg    Ketones, UA neg    Spec Grav, UA 1.020 1.010 - 1.025   Blood, UA neg    pH, UA 6.0 5.0 - 8.0   Protein, UA Negative Negative   Urobilinogen, UA     Nitrite, UA neg    Leukocytes, UA Moderate (2+) (A) Negative   Appearance     Odor       Assessment/Plan: Candidal vaginitis - Plan: fluconazole (DIFLUCAN) 150 MG tablet, POCT Wet Prep with KOH; Pos sx and wet prep. Rx diflucan. F/u prn. D/C thongs.   UTI symptoms - Plan: nitrofurantoin, macrocrystal-monohydrate, (MACROBID) 100 MG capsule, POCT Urinalysis Dipstick; Pos UA and sx. Rx macrobid. Check C&S. F/u prn.    Meds ordered this encounter  Medications  . nitrofurantoin, macrocrystal-monohydrate, (MACROBID) 100 MG capsule    Sig: Take 1 capsule (100 mg total) by mouth 2 (two) times daily for 5 days.    Dispense:  10 capsule    Refill:  0    Order Specific Question:   Supervising Provider    Answer:   Gae Dry [id number]  . fluconazole (DIFLUCAN) 150 MG tablet    Sig: Take 1 tablet (150 mg total) by mouth once for 1 dose. May repeat after 3 days  prn sx    Dispense:  2 tablet    Refill:  0    Order Specific Question:   Supervising Provider    Answer:   Gae Dry [id number]      Return if symptoms worsen or fail to improve.  Ollie Esty B. Jimma Ortman, PA-C 06/19/2019 11:57 AM

## 2019-06-21 LAB — URINE CULTURE

## 2019-12-02 ENCOUNTER — Other Ambulatory Visit: Payer: Self-pay | Admitting: Physician Assistant

## 2019-12-02 DIAGNOSIS — F418 Other specified anxiety disorders: Secondary | ICD-10-CM

## 2019-12-02 DIAGNOSIS — F3281 Premenstrual dysphoric disorder: Secondary | ICD-10-CM

## 2019-12-02 NOTE — Telephone Encounter (Signed)
Requested medication (s) are due for refill today:  Yes  Requested medication (s) are on the active medication list:  Yes  Future visit scheduled:  No  Last Refill: 02/21/19; # 60; RF x 5  Notes to Clinic:  Not delegated.  Attempted to call pt., as she is overdue for f/u appt.  Unable to leave vm, as the voice mailbox is full.    Requested Prescriptions  Pending Prescriptions Disp Refills   clonazePAM (KLONOPIN) 0.5 MG tablet [Pharmacy Med Name: clonazePAM 0.5 MG Oral Tablet] 60 tablet 0    Sig: Take 1 tablet by mouth twice daily as needed for anxiety      Not Delegated - Psychiatry:  Anxiolytics/Hypnotics Failed - 12/02/2019  3:42 PM      Failed - This refill cannot be delegated      Failed - Urine Drug Screen completed in last 360 days.      Failed - Valid encounter within last 6 months    Recent Outpatient Visits           9 months ago PMDD (premenstrual dysphoric disorder)   [place], [place]   10 months ago PMDD (premenstrual dysphoric disorder)   [facility], [place], [place]   1 year ago PMDD (premenstrual dysphoric disorder)   [facility], Clearnce Sorrel, PA-C   1 year ago PMDD (premenstrual dysphoric disorder)   [facility], Clearnce Sorrel, PA-C   2 years ago Low back strain, subsequent encounter   [place], [place], [place]

## 2019-12-03 ENCOUNTER — Other Ambulatory Visit: Payer: Self-pay | Admitting: Family Medicine

## 2019-12-11 ENCOUNTER — Telehealth: Payer: Self-pay | Admitting: Physician Assistant

## 2019-12-11 NOTE — Telephone Encounter (Signed)
Pt called stating that her medication, prozac, seems to not be helping anymore. Pt states that she is very emotional (sad) outbreaks, no energy, and in the bed. Pt is requesting to have something changed with her medication to help. Please advise.     [place], [place] - [place]  [place] [address]  Phone: [phone] Fax: [phone]  Hours: Not open 24 hours

## 2019-12-11 NOTE — Telephone Encounter (Signed)
Can increase what she has to 40mg  (take 2 of the 20mg  together or can spread out to twice daily dosing)

## 2019-12-11 NOTE — Telephone Encounter (Signed)
Patient advised as directed below. 

## 2020-01-07 ENCOUNTER — Other Ambulatory Visit: Payer: Self-pay

## 2020-01-07 ENCOUNTER — Other Ambulatory Visit: Payer: Self-pay | Admitting: Physician Assistant

## 2020-01-07 ENCOUNTER — Telehealth: Payer: Self-pay | Admitting: Physician Assistant

## 2020-01-07 DIAGNOSIS — F418 Other specified anxiety disorders: Secondary | ICD-10-CM

## 2020-01-07 DIAGNOSIS — F3281 Premenstrual dysphoric disorder: Secondary | ICD-10-CM

## 2020-01-07 MED ORDER — CLONAZEPAM 0.5 MG PO TABS: 0.5000 mg | ORAL_TABLET | Freq: Two times a day (BID) | ORAL | 5 refills | Status: DC | PRN

## 2020-01-07 NOTE — Telephone Encounter (Signed)
Requested medication (s) are due for refill today: yes   Requested medication (s) are on the active medication list: yes   Last refill:  12/03/2019  Future visit scheduled: yes   Notes to clinic:  this refill cannot be delegated    Requested Prescriptions  Pending Prescriptions Disp Refills   clonazePAM (KLONOPIN) 0.5 MG tablet 60 tablet 0    Sig: Take 1 tablet (0.5 mg total) by mouth 2 (two) times daily as needed. for anxiety      Not Delegated - Psychiatry:  Anxiolytics/Hypnotics Failed - 01/07/2020  1:35 PM      Failed - This refill cannot be delegated      Failed - Urine Drug Screen completed in last 360 days      Failed - Valid encounter within last 6 months    Recent Outpatient Visits           11 months ago PMDD (premenstrual dysphoric disorder)   [place], [place]   12 months ago PMDD (premenstrual dysphoric disorder)   [facility], [place], PA-C   1 year ago PMDD (premenstrual dysphoric disorder)   [facility], Clearnce Sorrel, PA-C   1 year ago PMDD (premenstrual dysphoric disorder)   [facility], Clearnce Sorrel, PA-C   2 years ago Low back strain, subsequent encounter   [place], [place], [place]       Future Appointments             Tomorrow Marlyn Corporal, Clearnce Sorrel, PA-C [facility], [place]

## 2020-01-07 NOTE — Telephone Encounter (Signed)
Requested medication (s) are due for refill today: yes   Requested medication (s) are on the active medication list: yes   Last refill:  12/03/2019  Future visit scheduled:  yes  Notes to clinic:  this refill cannot be delegated    Requested Prescriptions  Pending Prescriptions Disp Refills   clonazePAM (KLONOPIN) 0.5 MG tablet 60 tablet 0    Sig: Take 1 tablet (0.5 mg total) by mouth 2 (two) times daily as needed. for anxiety      Not Delegated - Psychiatry:  Anxiolytics/Hypnotics Failed - 01/07/2020  1:33 PM      Failed - This refill cannot be delegated      Failed - Urine Drug Screen completed in last 360 days      Failed - Valid encounter within last 6 months    Recent Outpatient Visits           11 months ago PMDD (premenstrual dysphoric disorder)   [place], [place]   12 months ago PMDD (premenstrual dysphoric disorder)   [facility], [place], PA-C   1 year ago PMDD (premenstrual dysphoric disorder)   [facility], Clearnce Sorrel, PA-C   1 year ago PMDD (premenstrual dysphoric disorder)   [facility], Clearnce Sorrel, PA-C   2 years ago Low back strain, subsequent encounter   [place], [place], [place]       Future Appointments             Tomorrow Marlyn Corporal, Clearnce Sorrel, PA-C [employer], [place]

## 2020-01-07 NOTE — Telephone Encounter (Signed)
Patient requesting clonazePAM (KLONOPIN) 0.5 MG tablet , informed please allow 48 to 72 hour turn around time     [place], [place] Phone:  [phone]  Fax:  [phone]

## 2020-01-08 ENCOUNTER — Telehealth (INDEPENDENT_AMBULATORY_CARE_PROVIDER_SITE_OTHER): Payer: Self-pay | Admitting: Physician Assistant

## 2020-01-08 DIAGNOSIS — F3281 Premenstrual dysphoric disorder: Secondary | ICD-10-CM

## 2020-01-08 DIAGNOSIS — G479 Sleep disorder, unspecified: Secondary | ICD-10-CM

## 2020-01-08 MED ORDER — FLUOXETINE HCL 20 MG PO CAPS: 40.0000 mg | ORAL_CAPSULE | Freq: Every day | ORAL | 1 refills | Status: DC

## 2020-01-08 MED ORDER — TRAZODONE HCL 50 MG PO TABS: 25.0000 mg | ORAL_TABLET | Freq: Every evening | ORAL | 3 refills | Status: DC | PRN

## 2020-01-08 NOTE — Progress Notes (Signed)
MyChart Video Visit    Virtual Visit via Video Note   This visit type was conducted due to national recommendations for restrictions regarding the COVID-19 Pandemic (e.g. social distancing) in an effort to limit this patient's exposure and mitigate transmission in our community. This patient is at least at moderate risk for complications without adequate follow up. This format is felt to be most appropriate for this patient at this time. Physical exam was limited by quality of the video and audio technology used for the visit.   Patient location: Home Provider location: [facility]  I discussed the limitations of evaluation and management by telemedicine and the availability of in person appointments. The patient expressed understanding and agreed to proceed.  Patient: Haley Moore   DOB: 10-25-1975   44 y.o. Female  MRN: [id number] Visit Date: 01/08/2020  Today's healthcare provider: Mar Daring, PA-C   No chief complaint on file.  Subjective    Anxiety Presents for follow-up visit. Symptoms include chest pain, decreased concentration, depressed mood, excessive worry, insomnia, irritability, muscle tension, nervous/anxious behavior, palpitations, panic and shortness of breath. Patient reports no suicidal ideas. Symptoms occur most days. The severity of symptoms is moderate. The quality of sleep is non-restorative. Nighttime awakenings: occasional.    Has been on Fluoxetine 20mg  for a while and had been working well. Now noticing that she is having more breakthrough symptoms.   Patient Active Problem List   Diagnosis Date Noted  . PMDD (premenstrual dysphoric disorder) 01/17/2017  . 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 History of chicken pox 12/08/2014  . History of colonic polyps 12/08/2014  . Genital warts 12/08/2014  . Premenstrual tension syndrome 12/08/2014  . Dysmenorrhea 07/16/2014  . Menorrhagia 07/16/2014  . Allergic rhinitis 01/20/2011  .  Migraine headache without aura    Past Medical History:  Diagnosis Date  . Allergy   . Anxiety   . Depression   . Dysmenorrhea   . Malignant melanoma of skin of chest ([place])    2018  . Menorrhagia   . Migraine       Medications: Outpatient Medications Prior to Visit  Medication Sig  . Cholecalciferol (VITAMIN D-1000 MAX ST) 1000 units tablet Take 1 tablet by mouth daily.  . clonazePAM (KLONOPIN) 0.5 MG tablet Take 1 tablet (0.5 mg total) by mouth 2 (two) times daily as needed. for anxiety  . COLLAGEN PO Take 1 tablet by mouth daily.  . furosemide (LASIX) 20 MG tablet TAKE 1 TABLET BY MOUTH AS NEEDED FOR LEG SWELLING  . ibuprofen (ADVIL,MOTRIN) 600 MG tablet TAKE 1 TABLET BY MOUTH EVERY 6 HOURS IF NEEDED  . Multiple Vitamin (MULTIVITAMIN) tablet Take 1 tablet by mouth daily.  . Nutritional Supplements (DHEA PO) Take by mouth.  . ondansetron (ZOFRAN) 4 MG tablet Take 1 tablet (4 mg total) by mouth every 8 (eight) hours as needed for nausea or vomiting.  . SUMAtriptan (IMITREX) 50 MG tablet Take 1 tablet (50 mg total) by mouth once for 1 dose. May repeat in 2 hours if headache persists or recurs.  . [DISCONTINUED] FLUoxetine (PROZAC) 20 MG capsule Take 1 capsule (20 mg total) by mouth daily.   No facility-administered medications prior to visit.    Review of Systems  Constitutional: Positive for fatigue and irritability.  Respiratory: Positive for chest tightness and shortness of breath.   Cardiovascular: Positive for chest pain and palpitations.  Psychiatric/Behavioral: Positive for agitation, decreased concentration and sleep disturbance. Negative for suicidal  ideas. The patient is nervous/anxious and has insomnia.     Last CBC Lab Results  Component Value Date   WBC 7.8 10/26/2016   HGB 11.8 (L) 10/26/2016   HCT 32.9 (L) 10/26/2016   MCV 93.1 10/26/2016   MCH 33.4 10/26/2016   RDW 12.6 10/26/2016   PLT 201 27/61/4709   Last metabolic panel Lab Results  Component  Value Date   GLUCOSE 134 (H) 10/26/2016   NA 137 10/26/2016   K 3.6 10/26/2016   CL 107 10/26/2016   CO2 26 10/26/2016   BUN 8 10/26/2016   CREATININE 0.50 10/26/2016   GFRNONAA >60 10/26/2016   GFRAA >60 10/26/2016   CALCIUM 8.3 (L) 10/26/2016   ANIONGAP 4 (L) 10/26/2016      Objective    There were no vitals taken for this visit. BP Readings from Last 3 Encounters:  06/19/19 100/70  12/29/18 120/81  04/29/18 100/72   Wt Readings from Last 3 Encounters:  06/19/19 201 lb (91.2 kg)  12/29/18 195 lb (88.5 kg)  04/29/18 200 lb (90.7 kg)      Physical Exam Vitals reviewed.  Constitutional:      Appearance: Normal appearance. She is well-developed.  HENT:     Head: Normocephalic and atraumatic.  Pulmonary:     Effort: Pulmonary effort is normal. No respiratory distress.  Musculoskeletal:     Cervical back: Normal range of motion and neck supple.  Neurological:     Mental Status: She is alert.  Psychiatric:        Attention and Perception: Attention and perception normal.        Mood and Affect: Mood is anxious and depressed.        Behavior: Behavior normal.        Thought Content: Thought content normal.        Judgment: Judgment normal.       Assessment & Plan     1. Difficulty sleeping Will start with trazodone as below to see if this helps sleep quality. F/U in 4-6 weeks.  - traZODone (DESYREL) 50 MG tablet; Take 0.5-1 tablets (25-50 mg total) by mouth at bedtime as needed for sleep.  Dispense: 30 tablet; Refill: 3  2. PMDD (premenstrual dysphoric disorder) Worsening. Increase Fluoxetine to 40mg  as below. F/U in 4-6 weeks.  - FLUoxetine (PROZAC) 20 MG capsule; Take 2 capsules (40 mg total) by mouth daily.  Dispense: 180 capsule; Refill: 1   Return in about 4 weeks (around 02/05/2020).     I discussed the assessment and treatment plan with the patient. The patient was provided an opportunity to ask questions and all were answered. The patient agreed with  the plan and demonstrated an understanding of the instructions.   The patient was advised to call back or seek an in-person evaluation if the symptoms worsen or if the condition fails to improve as anticipated.  I provided 13 minutes of face-to-face time during this encounter via MyChart Video enabled encounter.  Reynolds Bowl, PA-C, have reviewed all documentation for this visit. The documentation on 01/14/20 for the exam, diagnosis, procedures, and orders are all accurate and complete.  [place] [facility] [phone] (phone) [phone] (fax)  [place]

## 2020-01-14 ENCOUNTER — Encounter: Payer: Self-pay | Admitting: Physician Assistant

## 2020-02-17 ENCOUNTER — Telehealth: Payer: Self-pay | Admitting: Physician Assistant

## 2020-02-17 DIAGNOSIS — F3281 Premenstrual dysphoric disorder: Secondary | ICD-10-CM

## 2020-02-19 MED ORDER — FLUOXETINE HCL 20 MG PO CAPS: 40.0000 mg | ORAL_CAPSULE | Freq: Every day | ORAL | 1 refills | Status: DC

## 2020-02-19 NOTE — Addendum Note (Signed)
Addended by: Mar Daring on: 02/19/2020 03:52 PM   Modules accepted: Orders

## 2020-02-19 NOTE — Telephone Encounter (Signed)
Pt called stating that she is completely put of this medication and that she followed the instructions. Please advise.

## 2020-02-19 NOTE — Telephone Encounter (Signed)
Refilled for her 

## 2020-06-04 ENCOUNTER — Telehealth: Payer: Self-pay

## 2020-06-04 ENCOUNTER — Ambulatory Visit (INDEPENDENT_AMBULATORY_CARE_PROVIDER_SITE_OTHER): Payer: Self-pay | Admitting: Family Medicine

## 2020-06-04 DIAGNOSIS — J0100 Acute maxillary sinusitis, unspecified: Secondary | ICD-10-CM

## 2020-06-04 MED ORDER — AMOXICILLIN 875 MG PO TABS: 875.0000 mg | ORAL_TABLET | Freq: Two times a day (BID) | ORAL | 0 refills | Status: AC

## 2020-06-04 NOTE — Progress Notes (Signed)
Telephone Visit    Virtual Visit via Video Note   This visit type was conducted due to national recommendations for restrictions regarding the COVID-19 Pandemic (e.g. social distancing) in an effort to limit this patient's exposure and mitigate transmission in our community. This patient is at least at moderate risk for complications without adequate follow up. This format is felt to be most appropriate for this patient at this time. Physical exam was limited by quality of the  audio technology used for the visit.   Patient location: Home Provider location: Office  I discussed the limitations of evaluation and management by telemedicine and the availability of in person appointments. The patient expressed understanding and agreed to proceed.  Patient: Haley Moore   DOB: 1975-07-08   45 y.o. Female  MRN: [id number] Visit Date: 06/04/2020  Today's healthcare provider: Vernie Murders, PA-C   No chief complaint on file.  Subjective    URI  This is a new problem. The current episode started 1 to 4 weeks ago. The problem has been gradually worsening. There has been no fever. Associated symptoms include congestion, coughing, ear pain, headaches and sinus pain. Pertinent negatives include no chest pain, nausea, sore throat, vomiting or wheezing. She has tried antihistamine and decongestant for the symptoms. The treatment provided no relief.      Past Medical History:  Diagnosis Date  . Allergy   . Anxiety   . Depression   . Dysmenorrhea   . Malignant melanoma of skin of chest ([place])    2018  . Menorrhagia   . Migraine    Past Surgical History:  Procedure Laterality Date  . BREAST ENHANCEMENT SURGERY    . LAPAROSCOPIC TUBAL LIGATION N/A 10/25/2016   Procedure: LAPAROSCOPIC TUBAL LIGATION;  Surgeon: Malachy Mood, MD;  Location: [facility] ORS;  Service: Gynecology;  Laterality: N/A;  . SKIN CANCER EXCISION    . WRIST SURGERY     Social History   Tobacco Use  . Smoking  status: Former Smoker    Types: Cigarettes    Quit date: 02/07/1998    Years since quitting: 22.3  . Smokeless tobacco: Never Used  . Tobacco comment: 1 PACK WEEKLY  Vaping Use  . Vaping Use: Never used  Substance Use Topics  . Alcohol use: Yes    Alcohol/week: 0.0 standard drinks    Comment: RARE  . Drug use: No   Family History  Problem Relation Age of Onset  . Heart disease Mother   . Uterine cancer Maternal Grandmother 50       vs ovar ca   No Known Allergies    Medications: Outpatient Medications Prior to Visit  Medication Sig  . Cholecalciferol (VITAMIN D-1000 MAX ST) 1000 units tablet Take 1 tablet by mouth daily.  . clonazePAM (KLONOPIN) 0.5 MG tablet Take 1 tablet (0.5 mg total) by mouth 2 (two) times daily as needed. for anxiety  . COLLAGEN PO Take 1 tablet by mouth daily.  Marland Kitchen FLUoxetine (PROZAC) 20 MG capsule Take 2 capsules (40 mg total) by mouth daily.  . furosemide (LASIX) 20 MG tablet TAKE 1 TABLET BY MOUTH AS NEEDED FOR LEG SWELLING  . ibuprofen (ADVIL,MOTRIN) 600 MG tablet TAKE 1 TABLET BY MOUTH EVERY 6 HOURS IF NEEDED  . Multiple Vitamin (MULTIVITAMIN) tablet Take 1 tablet by mouth daily.  . Nutritional Supplements (DHEA PO) Take by mouth.  . ondansetron (ZOFRAN) 4 MG tablet Take 1 tablet (4 mg total) by mouth every 8 (eight) hours  as needed for nausea or vomiting.  . SUMAtriptan (IMITREX) 50 MG tablet Take 1 tablet (50 mg total) by mouth once for 1 dose. May repeat in 2 hours if headache persists or recurs.  . traZODone (DESYREL) 50 MG tablet Take 0.5-1 tablets (25-50 mg total) by mouth at bedtime as needed for sleep.   No facility-administered medications prior to visit.    Review of Systems  Constitutional: Negative for chills and fever.  HENT: Positive for congestion, ear discharge, ear pain, sinus pressure and sinus pain. Negative for sore throat.   Respiratory: Positive for cough. Negative for chest tightness, shortness of breath and wheezing.    Cardiovascular: Negative for chest pain and palpitations.  Gastrointestinal: Negative for nausea and vomiting.  Neurological: Positive for headaches.      Objective    There were no vitals taken for this visit.   Physical Exam: No apparent acute respiratory distress during telephonic interview.     Assessment & Plan     1. Acute maxillary sinusitis, recurrence not specified Onset with cough, nasal congestion, PND, scratchy throat and headache around eyes the past 11 days. Negative COVID test last week. No fever or loss of taste. No body aches, chills or GI upset. Using Mucinex-D, Robitussin-DM, Zyrtec and Ibuprofen with mild relief of symptoms. Treat with antibiotic and add Flonase Nasal Spray. Recheck prn. - amoxicillin (AMOXIL) 875 MG tablet; Take 1 tablet (875 mg total) by mouth 2 (two) times daily for 10 days.  Dispense: 20 tablet; Refill: 0   No follow-ups on file.     I discussed the assessment and treatment plan with the patient. The patient was provided an opportunity to ask questions and all were answered. The patient agreed with the plan and demonstrated an understanding of the instructions.   The patient was advised to call back or seek an in-person evaluation if the symptoms worsen or if the condition fails to improve as anticipated.  I provided 15 minutes of non-face-to-face time during this encounter.  I, Esty Ahuja, PA-C, have reviewed all documentation for this visit. The documentation on 06/04/20 for the exam, diagnosis, procedures, and orders are all accurate and complete.   Vernie Murders, PA-C [facility] [phone] (phone) [phone] (fax)  [place]

## 2020-06-04 NOTE — Telephone Encounter (Signed)
Copied from [place] [id number]. Topic: Appointment Scheduling - Scheduling Inquiry for Clinic >> Jun 03, 2020  6:12 PM Erick Blinks wrote: Reason for CRM: Pt called reporting a possible sinus infection, declined next available since it is may 9th. Please advise, seeking advice from office Best contact: [phone]

## 2020-10-02 ENCOUNTER — Other Ambulatory Visit: Payer: Self-pay | Admitting: Physician Assistant

## 2020-10-02 DIAGNOSIS — F3281 Premenstrual dysphoric disorder: Secondary | ICD-10-CM

## 2020-10-06 ENCOUNTER — Other Ambulatory Visit: Payer: Self-pay

## 2020-10-06 DIAGNOSIS — F3281 Premenstrual dysphoric disorder: Secondary | ICD-10-CM

## 2020-10-06 MED ORDER — FLUOXETINE HCL 20 MG PO CAPS: 40.0000 mg | ORAL_CAPSULE | Freq: Every day | ORAL | 0 refills | Status: DC

## 2020-10-06 NOTE — Telephone Encounter (Signed)
Copied from [place] [id number]. Topic: General - Other >> Oct 06, 2020 11:03 AM Yvette Rack wrote: Reason for CRM: Pt stated she requested a refill last week but the pharmacy has not received an approval. Pt requests an update on the refill request for FLUoxetine HCl 20 MG

## 2020-10-06 NOTE — Telephone Encounter (Signed)
Please review the Drug to Drug warnings.   Thanks,  -Mickel Baas

## 2020-10-07 ENCOUNTER — Other Ambulatory Visit: Payer: Self-pay | Admitting: Physician Assistant

## 2020-10-07 DIAGNOSIS — G43109 Migraine with aura, not intractable, without status migrainosus: Secondary | ICD-10-CM

## 2020-10-28 ENCOUNTER — Telehealth: Payer: Self-pay | Admitting: Family Medicine

## 2020-10-28 NOTE — Telephone Encounter (Signed)
Pt called stating that she has been taking the medication, Fluoxetine. She states that the medication is not working for her anymore and is requesting to have this increased or be put on another medication. She states that she has been having trouble controlling emotions & migraine headaches. Please advise.       [place], [place] - [place]  [place] [address]  Phone: [phone] Fax: [phone]  Hours: Not open 24 hours

## 2021-01-11 ENCOUNTER — Telehealth: Payer: Self-pay

## 2021-01-11 NOTE — Telephone Encounter (Signed)
Copied from [place] [id number]. Topic: General - Other >> Jan 11, 2021 10:37 AM Pawlus, Brayton Layman A wrote: Reason for CRM: Pt no longer wants to be a pt at [facility], pt stated she will find a new pcp. Pt stated last time she was at [facility] she saw a "horrible skinny white female who wore no makeup" Pt was very rude and last person pt saw was [employer].

## 2021-01-12 ENCOUNTER — Other Ambulatory Visit: Payer: Self-pay | Admitting: Physician Assistant

## 2021-01-12 DIAGNOSIS — G479 Sleep disorder, unspecified: Secondary | ICD-10-CM

## 2021-01-12 DIAGNOSIS — F418 Other specified anxiety disorders: Secondary | ICD-10-CM

## 2021-01-12 DIAGNOSIS — F3281 Premenstrual dysphoric disorder: Secondary | ICD-10-CM

## 2021-01-15 ENCOUNTER — Ambulatory Visit: Payer: Self-pay | Admitting: Family Medicine

## 2021-01-18 NOTE — Telephone Encounter (Signed)
LOV:  06/04/2020  NOV: None   Last Refill: 01/07/2020 #60 5 Refills   Thanks,   -Mickel Baas

## 2021-03-08 ENCOUNTER — Other Ambulatory Visit: Payer: Self-pay | Admitting: Physician Assistant

## 2021-03-08 DIAGNOSIS — Z01818 Encounter for other preprocedural examination: Secondary | ICD-10-CM

## 2021-03-08 NOTE — Progress Notes (Signed)
Per notice from Dr Jonathon Bellows at [facility]

## 2021-08-16 ENCOUNTER — Other Ambulatory Visit: Payer: Self-pay | Admitting: Family Medicine

## 2021-08-16 DIAGNOSIS — F3281 Premenstrual dysphoric disorder: Secondary | ICD-10-CM

## 2021-08-16 DIAGNOSIS — F418 Other specified anxiety disorders: Secondary | ICD-10-CM

## 2021-08-16 NOTE — Telephone Encounter (Signed)
Patient needs OV. Last office visit was over a year ago.

## 2021-09-06 ENCOUNTER — Encounter: Payer: Self-pay | Admitting: Physician Assistant

## 2021-09-06 ENCOUNTER — Ambulatory Visit (INDEPENDENT_AMBULATORY_CARE_PROVIDER_SITE_OTHER): Payer: Self-pay | Admitting: Physician Assistant

## 2021-09-06 VITALS — BP 121/87 | HR 65 | Ht 64.0 in | Wt 212.8 lb

## 2021-09-06 DIAGNOSIS — F3281 Premenstrual dysphoric disorder: Secondary | ICD-10-CM

## 2021-09-06 DIAGNOSIS — G43009 Migraine without aura, not intractable, without status migrainosus: Secondary | ICD-10-CM

## 2021-09-06 MED ORDER — CLONAZEPAM 0.5 MG PO TABS: 0.5000 mg | ORAL_TABLET | Freq: Two times a day (BID) | ORAL | 0 refills | Status: DC | PRN

## 2021-09-06 MED ORDER — FLUOXETINE HCL 20 MG PO CAPS: 40.0000 mg | ORAL_CAPSULE | Freq: Every day | ORAL | 3 refills | Status: DC

## 2021-09-06 MED ORDER — SUMATRIPTAN SUCCINATE 50 MG PO TABS: ORAL_TABLET | ORAL | 1 refills | Status: DC

## 2021-09-06 NOTE — Progress Notes (Signed)
I,[name],acting as a Education administrator for [employer], PA-C.,have documented all relevant documentation on the behalf of [name], PA-C,as directed by  [name], PA-C while in the presence of [name], PA-C.   Established patient visit   Patient: Haley Moore   DOB: 1975-10-29   46 y.o. Female  MRN: [id number] Visit Date: 09/06/2021  Today's healthcare provider: [name], PA-C   Cc. Anxiety, pmdd, migraines  Subjective    HPI   Migraines -Typically before menstrual cycle/during. First three days. Used to manage with sumatriptan w/ success. No changes to headache severity. Can cause nausea/vomiting.  Anxiety, Follow-up  She was last seen for anxiety 19 months ago.(01/08/20) Changes made at last visit include no changes.   She reports excellent compliance with treatment. She reports excellent tolerance of treatment. She is not having side effects.   She feels her anxiety is between mild and moderate and Unchanged since last visit.  Symptoms: No chest pain Yes difficulty concentrating  No dizziness No fatigue  Yes feelings of losing control No insomnia  No irritable No palpitations  No panic attacks Yes racing thoughts  No shortness of breath No sweating  No tremors/shakes    GAD-7 Results    10/09/2018    3:02 PM  GAD-7 Generalized Anxiety Disorder Screening Tool  1. Feeling Nervous, Anxious, or on Edge 2  2. Not Being Able to Stop or Control Worrying 2  3. Worrying Too Much About Different Things 2  4. Trouble Relaxing 2  5. Being So Restless it's Hard To Sit Still 2  6. Becoming Easily Annoyed or Irritable 2  7. Feeling Afraid As If Something Awful Might Happen 2  Total GAD-7 Score 14  Difficulty At Work, Home, or Getting  Along With Others? Extremely difficult    PHQ-9 Scores    09/06/2021    1:17 PM 07/13/2017    2:10 PM  PHQ9 SCORE ONLY  PHQ-9 Total Score 21 19     --------------------------------------------------------------------------------------------------- Follow up for premenstrual dysphoric disorder  The patient was last seen for this 19 months ago. (01/08/20) Changes made at last visit include no changes.  She reports excellent compliance with treatment. She feels that condition is Unchanged. She is not having side effects.   -----------------------------------------------------------------------------------------   Medications: Outpatient Medications Prior to Visit  Medication Sig   Cholecalciferol 25 MCG (1000 UT) tablet Take 1 tablet by mouth daily.   COLLAGEN PO Take 1 tablet by mouth daily.   Multiple Vitamin (MULTIVITAMIN) tablet Take 1 tablet by mouth daily.   Nutritional Supplements (DHEA PO) Take by mouth.   [DISCONTINUED] furosemide (LASIX) 20 MG tablet TAKE 1 TABLET BY MOUTH AS NEEDED FOR LEG SWELLING   [DISCONTINUED] ibuprofen (ADVIL,MOTRIN) 600 MG tablet TAKE 1 TABLET BY MOUTH EVERY 6 HOURS IF NEEDED   [DISCONTINUED] ondansetron (ZOFRAN) 4 MG tablet Take 1 tablet (4 mg total) by mouth every 8 (eight) hours as needed for nausea or vomiting.   [DISCONTINUED] SUMAtriptan (IMITREX) 50 MG tablet TAKE 1 TABLET BY MOUTH ONCE DAILY FOR ONE DOSE. MAY REPEAT IN 2 HOURS IF HEADACHE PERSISTS OR RECURS   [DISCONTINUED] traZODone (DESYREL) 50 MG tablet Take 0.5-1 tablets (25-50 mg total) by mouth at bedtime as needed for sleep. PATIENT NEEDS TO SCHEDULE OFFICE VISIT FOR FOLLOW UP   [DISCONTINUED] clonazePAM (KLONOPIN) 0.5 MG tablet Take 1 tablet (0.5 mg total) by mouth 2 (two) times daily as needed for anxiety. PATIENT NEEDS TO SCHEDULE OFFICE VISIT  FOR FOLLOW UP (Patient not taking: Reported on 09/06/2021)   [DISCONTINUED] FLUoxetine (PROZAC) 20 MG capsule Take 2 capsules by mouth once daily (Patient not taking: Reported on 09/06/2021)   [DISCONTINUED] FLUoxetine (PROZAC) 20 MG capsule Take 2 capsules (40 mg total) by mouth daily.  (Patient not taking: Reported on 09/06/2021)   No facility-administered medications prior to visit.    Review of Systems  Constitutional:  Negative for fatigue and fever.  Respiratory:  Negative for cough and shortness of breath.   Cardiovascular:  Negative for chest pain and leg swelling.  Gastrointestinal:  Negative for abdominal pain.  Neurological:  Negative for dizziness and headaches.  Psychiatric/Behavioral:  The patient is nervous/anxious.        Objective    Blood pressure 121/87, pulse 65, height '5\' 4"'$  (1.626 m), weight 212 lb 12.8 oz (96.5 kg), SpO2 98 %.   Physical Exam Constitutional:      General: She is awake.     Appearance: She is well-developed.  HENT:     Head: Normocephalic.  Eyes:     Conjunctiva/sclera: Conjunctivae normal.  Cardiovascular:     Rate and Rhythm: Normal rate and regular rhythm.     Heart sounds: Normal heart sounds.  Pulmonary:     Effort: Pulmonary effort is normal.     Breath sounds: Normal breath sounds.  Skin:    General: Skin is warm.  Neurological:     Mental Status: She is alert and oriented to person, place, and time.  Psychiatric:        Attention and Perception: Attention normal.        Mood and Affect: Mood normal.        Speech: Speech normal.        Behavior: Behavior is cooperative.      No results found for any visits on 09/06/21.  Assessment & Plan     Problem List Items Addressed This Visit       Cardiovascular and Mediastinum   Migraine headache without aura    Chronic and stable. Refilled sumatriptan.       Relevant Medications   FLUoxetine (PROZAC) 20 MG capsule   clonazePAM (KLONOPIN) 0.5 MG tablet   SUMAtriptan (IMITREX) 50 MG tablet     Other   PMDD (premenstrual dysphoric disorder) - Primary    Pt off prozac from last 2-3 weeks. Used to take 40 mg. Advised restarting at 20 mg for 1-2 weeks before increasing to 40 mg.  Klonopin prn severe anxiety/depression, based on prior refill sched pt  takes infrequently. Refilled for #30      Relevant Medications   FLUoxetine (PROZAC) 20 MG capsule   clonazePAM (KLONOPIN) 0.5 MG tablet     Return in about 6 months (around 03/09/2022) for CPE.      I, [name], PA-C have reviewed all documentation for this visit. The documentation on  09/06/2021 for the exam, diagnosis, procedures, and orders are all accurate and complete.  [name], PA-C [facility] [address] [place], [place], [address] Office: [phone] Fax: [place]

## 2021-09-06 NOTE — Assessment & Plan Note (Signed)
Pt off prozac from last 2-3 weeks. Used to take 40 mg. Advised restarting at 20 mg for 1-2 weeks before increasing to 40 mg.  Klonopin prn severe anxiety/depression, based on prior refill sched pt takes infrequently. Refilled for #30

## 2021-09-06 NOTE — Assessment & Plan Note (Signed)
Chronic and stable. Refilled sumatriptan.

## 2021-11-24 ENCOUNTER — Ambulatory Visit (INDEPENDENT_AMBULATORY_CARE_PROVIDER_SITE_OTHER): Payer: Self-pay | Admitting: Physician Assistant

## 2021-11-24 ENCOUNTER — Encounter: Payer: Self-pay | Admitting: Physician Assistant

## 2021-11-24 VITALS — BP 111/77 | HR 67 | Ht 64.0 in | Wt 213.0 lb

## 2021-11-24 DIAGNOSIS — R41840 Attention and concentration deficit: Secondary | ICD-10-CM

## 2021-11-24 DIAGNOSIS — G43009 Migraine without aura, not intractable, without status migrainosus: Secondary | ICD-10-CM

## 2021-11-24 DIAGNOSIS — Z1231 Encounter for screening mammogram for malignant neoplasm of breast: Secondary | ICD-10-CM

## 2021-11-24 DIAGNOSIS — F3281 Premenstrual dysphoric disorder: Secondary | ICD-10-CM

## 2021-11-24 MED ORDER — SUMATRIPTAN SUCCINATE 50 MG PO TABS: ORAL_TABLET | ORAL | 1 refills | Status: DC

## 2021-11-24 NOTE — Progress Notes (Signed)
I,Haley Moore,acting as a Education administrator for [employer], PA-C.,have documented all relevant documentation on the behalf of Haley Kirschner, PA-C,as directed by  Haley Kirschner, PA-C while in the presence of Haley Kirschner, PA-C.   Established patient visit   Patient: Haley Moore   DOB: [birth year]   46 y.o. Female  MRN: [id number] Visit Date: 11/24/2021  Today's healthcare provider: [name], PA-C   Cc. Difficulty concentrating  Subjective    HPI  Haley Moore is a 46 y/o female who presents today with concerns over increased difficulty in concentrating. She states she has always had a difficult time multi-tasking, but lately is stopping mid-task and moving on, not remembering to go back and finish. She attests to short-term memory issues, one example being taking advil for a headache and trying to take more 10 minutes later, but being stopped by coworkers. She attests to increased stress in her relationship, has started counseling. She reports her counselor believes she has ADHD . Her attention issues have started to affect her work lately.   Medications: Outpatient Medications Prior to Visit  Medication Sig   Cholecalciferol 25 MCG (1000 UT) tablet Take 1 tablet by mouth daily.   clonazePAM (KLONOPIN) 0.5 MG tablet Take 1 tablet (0.5 mg total) by mouth 2 (two) times daily as needed for anxiety.   COLLAGEN PO Take 1 tablet by mouth daily.   FLUoxetine (PROZAC) 20 MG capsule Take 2 capsules (40 mg total) by mouth daily.   Multiple Vitamin (MULTIVITAMIN) tablet Take 1 tablet by mouth daily.   Nutritional Supplements (DHEA PO) Take by mouth.   [DISCONTINUED] SUMAtriptan (IMITREX) 50 MG tablet TAKE 1 TABLET BY MOUTH ONCE DAILY FOR ONE DOSE. MAY REPEAT IN 2 HOURS IF HEADACHE PERSISTS OR RECURS   No facility-administered medications prior to visit.    Review of Systems  Constitutional:  Negative for fatigue and fever.  Respiratory:  Negative for cough and shortness of breath.    Cardiovascular:  Negative for chest pain and leg swelling.  Gastrointestinal:  Negative for abdominal pain.  Neurological:  Negative for dizziness and headaches.  Psychiatric/Behavioral:  Positive for decreased concentration.        Objective  Blood pressure 111/77, pulse 67, height '5\' 4"'$  (1.626 m), weight 213 lb (96.6 kg), SpO2 98 %.   Physical Exam Vitals reviewed.  Constitutional:      Appearance: She is not ill-appearing.  HENT:     Head: Normocephalic.  Eyes:     Conjunctiva/sclera: Conjunctivae normal.  Cardiovascular:     Rate and Rhythm: Normal rate.  Pulmonary:     Effort: Pulmonary effort is normal. No respiratory distress.  Neurological:     General: No focal deficit present.     Mental Status: She is alert and oriented to person, place, and time.  Psychiatric:        Mood and Affect: Mood normal.        Behavior: Behavior normal.      No results found for any visits on 11/24/21.  Assessment & Plan     1 Difficulty concentrating 2. PMDD Discussed ref to psychiatry would be our best starting point. Pt agreeable Referral placed Advised to continue counseling  3. Migraines  are stable pulled for refill   Problem List Items Addressed This Visit       Cardiovascular and Mediastinum   Migraine headache without aura   Relevant Medications   SUMAtriptan (IMITREX) 50 MG tablet     Other  PMDD (premenstrual dysphoric disorder) - Primary   Relevant Orders   Ambulatory referral to Psychiatry   Difficulty concentrating   Relevant Orders   Ambulatory referral to Psychiatry   Other Visit Diagnoses     Breast cancer screening by mammogram       Relevant Orders   MM 3D SCREEN BREAST BILATERAL       Return in about 3 months (around 02/24/2022) for CPE.      I, Haley Kirschner, PA-C have reviewed all documentation for this visit. The documentation on  11/24/2021 for the exam, diagnosis, procedures, and orders are all accurate and complete.  Haley Kirschner, PA-C [facility] [address] [place], [place], [address] Office: [phone] Fax: [place]

## 2021-12-24 ENCOUNTER — Other Ambulatory Visit: Payer: Self-pay | Admitting: Physician Assistant

## 2021-12-24 DIAGNOSIS — Z1211 Encounter for screening for malignant neoplasm of colon: Secondary | ICD-10-CM

## 2022-01-06 ENCOUNTER — Other Ambulatory Visit: Payer: Self-pay | Admitting: Family Medicine

## 2022-01-06 ENCOUNTER — Encounter: Payer: Self-pay | Admitting: Physician Assistant

## 2022-01-06 DIAGNOSIS — F3281 Premenstrual dysphoric disorder: Secondary | ICD-10-CM

## 2022-01-06 NOTE — Telephone Encounter (Signed)
Requested medication (s) are due for refill today:   Provider to review  Requested medication (s) are on the active medication list:   Yes  Future visit scheduled:   Yes   Last ordered: 09/06/2021 #30, 0 refills  Non delegated refill   Requested Prescriptions  Pending Prescriptions Disp Refills   clonazePAM (KLONOPIN) 0.5 MG tablet [Pharmacy Med Name: clonazePAM 0.5 MG Oral Tablet] 30 tablet 0    Sig: Take 1 tablet by mouth twice daily as needed for anxiety     Not Delegated - Psychiatry: Anxiolytics/Hypnotics 2 Failed - 01/06/2022  9:36 AM      Failed - This refill cannot be delegated      Failed - Urine Drug Screen completed in last 360 days      Passed - Patient is not pregnant      Passed - Valid encounter within last 6 months    Recent Outpatient Visits           1 month ago Difficulty concentrating   [facility], [place], PA-C   4 months ago PMDD (premenstrual dysphoric disorder)   [employer], [place], PA-C   1 year ago Acute maxillary sinusitis, recurrence not specified   [employer], Vickki Muff, PA-C   1 year ago Difficulty sleeping   [place], [place], [place]   2 years ago PMDD (premenstrual dysphoric disorder)   [employer], Clearnce Sorrel, [place]       Future Appointments             In 1 month Drubel, Ria Comment, PA-C [employer], [facility]

## 2022-01-24 ENCOUNTER — Ambulatory Visit: Payer: Self-pay | Admitting: Obstetrics and Gynecology

## 2022-02-24 ENCOUNTER — Encounter: Payer: Self-pay | Admitting: Physician Assistant

## 2022-03-17 ENCOUNTER — Encounter: Payer: Self-pay | Admitting: Physician Assistant

## 2022-03-17 ENCOUNTER — Telehealth (INDEPENDENT_AMBULATORY_CARE_PROVIDER_SITE_OTHER): Payer: BC Managed Care – PPO | Admitting: Physician Assistant

## 2022-03-17 ENCOUNTER — Ambulatory Visit: Payer: Self-pay

## 2022-03-17 VITALS — Ht 64.0 in | Wt 219.0 lb

## 2022-03-17 DIAGNOSIS — N920 Excessive and frequent menstruation with regular cycle: Secondary | ICD-10-CM | POA: Diagnosis not present

## 2022-03-17 DIAGNOSIS — F3281 Premenstrual dysphoric disorder: Secondary | ICD-10-CM

## 2022-03-17 MED ORDER — VENLAFAXINE HCL ER 37.5 MG PO CP24: 37.5000 mg | ORAL_CAPSULE | Freq: Every day | ORAL | 1 refills | Status: DC

## 2022-03-17 NOTE — Assessment & Plan Note (Signed)
Encouraged pt to make appt with gyn She reports trying several OCPs, IUD options without tolerating any  Advised pt to complete a cbc, iron panel, d/t frequent bleeding

## 2022-03-17 NOTE — Assessment & Plan Note (Addendum)
Unclear if this is the underlying dx. Pt did not follow through with last psychiatrist.  Pt was taking prozac but self d/c - caused sexual dysfunction Suggested trying effexor 37.5 mg XR-- after one week can increase to 75 mg.  Encouraged her to get back into therapy Fu 6 weeks.

## 2022-03-17 NOTE — Telephone Encounter (Signed)
    Chief Complaint: Anxiety, depression Symptoms: Above Frequency: Years Pertinent Negatives: Patient denies any Disposition: '[]'$ ED /'[]'$ Urgent Care (no appt availability in office) / '[x]'$ Appointment(In office/virtual)/ '[]'$  [facility]/ '[]'$ Home Care/ '[]'$ Refused Recommended Disposition /'[]'$ [facility]/ '[]'$  Follow-up with PCP Additional Notes:   Reason for Disposition  Patient sounds very upset or troubled to the triager  Answer Assessment - Initial Assessment Questions 1. CONCERN: "Did anything happen that prompted you to call today?"      Anxiety 2. ANXIETY SYMPTOMS: "Can you describe how you (your loved one; patient) have been feeling?" (e.g., tense, restless, panicky, anxious, keyed up, overwhelmed, sense of impending doom).      Panic, Sad 3. ONSET: "How long have you been feeling this way?" (e.g., hours, days, weeks)     Years 4. SEVERITY: "How would you rate the level of anxiety?" (e.g., 0 - 10; or mild, moderate, severe).     Severe 5. FUNCTIONAL IMPAIRMENT: "How have these feelings affected your ability to do daily activities?" "Have you had more difficulty than usual doing your normal daily activities?" (e.g., getting better, same, worse; self-care, school, work, interactions)     Sleep and appetite are affected 6. HISTORY: "Have you felt this way before?" "Have you ever been diagnosed with an anxiety problem in the past?" (e.g., generalized anxiety disorder, panic attacks, PTSD). If Yes, ask: "How was this problem treated?" (e.g., medicines, counseling, etc.)     Yes 7. RISK OF HARM - SUICIDAL IDEATION: "Do you ever have thoughts of hurting or killing yourself?" If Yes, ask:  "Do you have these feelings now?" "Do you have a plan on how you would do this?"     No 8. TREATMENT:  "What has been done so far to treat this anxiety?" (e.g., medicines, relaxation strategies). "What has helped?"     Medication 9. TREATMENT - THERAPIST: "Do you have a counselor or  therapist? Name?"     Yes - not seeing anyone now. 10. POTENTIAL TRIGGERS: "Do you drink caffeinated beverages (e.g., coffee, colas, teas), and how much daily?" "Do you drink alcohol or use any drugs?" "Have you started any new medicines recently?"       No 11. PATIENT SUPPORT: "Who is with you now?" "Who do you live with?" "Do you have family or friends who you can talk to?"        Family 12. OTHER SYMPTOMS: "Do you have any other symptoms?" (e.g., feeling depressed, trouble concentrating, trouble sleeping, trouble breathing, palpitations or fast heartbeat, chest pain, sweating, nausea, or diarrhea)       Anxiety 13. PREGNANCY: "Is there any chance you are pregnant?" "When was your last menstrual period?"       No  Protocols used: Anxiety and Panic Attack-A-AH

## 2022-03-17 NOTE — Progress Notes (Signed)
I,[name],acting as a Education administrator for [employer], PA-C.,have documented all relevant documentation on the behalf of [name], PA-C,as directed by  [name], PA-C while in the presence of [name], PA-C.  MyChart Video Visit    Virtual Visit via Video Note   This format is felt to be most appropriate for this patient at this time. Physical exam was limited by quality of the video and audio technology used for the visit.   Patient location: home Provider location: [facility]  I discussed the limitations of evaluation and management by telemedicine and the availability of in person appointments. The patient expressed understanding and agreed to proceed.  Patient: Haley Moore   DOB: [birth year]   47 y.o. Female  MRN: [id number] Visit Date: 03/17/2022  Today's healthcare provider: [name], PA-C   Cc. Anxiety/depression f/u  Subjective    HPI  Anxiety, Follow-up Pt reports self d/c her prozac as it was causing sexual dysfunction. Reports now feeling anxiety, depressive thoughts. Wants to get back on a medication but does not want the sexual side effects.  She was in therapy but has not been recently.   She feels her ongoing anxiety and depression is related to PMDD and perimenopause. Reports still having frequent bleeding, but has not followed through with recommendations of gynecology.   She feels her anxiety is severe and Worse since last visit.  Symptoms: No chest pain Yes difficulty concentrating  No dizziness Yes fatigue  Yes feelings of losing control No insomnia  Yes irritable No palpitations  Yes panic attacks Yes racing thoughts  No shortness of breath No sweating  No tremors/shakes    GAD-7 Results    03/17/2022    1:02 PM 09/06/2021    1:40 PM 10/09/2018    3:02 PM  GAD-7 Generalized Anxiety Disorder Screening Tool  1. Feeling Nervous, Anxious, or on Edge '3 3 2  '$ 2. Not Being Able to Stop or Control Worrying '3  3 2  '$ 3. Worrying Too Much About Different Things '3 3 2  '$ 4. Trouble Relaxing '3 2 2  '$ 5. Being So Restless it's Hard To Sit Still '3 1 2  '$ 6. Becoming Easily Annoyed or Irritable '3 3 2  '$ 7. Feeling Afraid As If Something Awful Might Happen '3 1 2  '$ Total GAD-7 Score '21 16 14  '$ Difficulty At Work, Home, or Getting  Along With Others? Extremely difficult Very difficult Extremely difficult   Current symptoms include: hypersomnia      03/17/2022    1:04 PM 09/06/2021    1:17 PM 07/13/2017    2:10 PM  Depression screen PHQ 2/9  Decreased Interest 3 3 0  Down, Depressed, Hopeless '3 3 2  '$ PHQ - 2 Score '6 6 2  '$ Altered sleeping '3 3 2  '$ Tired, decreased energy '3 3 3  '$ Change in appetite '3 3 3  '$ Feeling bad or failure about yourself  '3 3 3  '$ Trouble concentrating '3 3 3  '$ Moving slowly or fidgety/restless 0 0 2  Suicidal thoughts 0 0 1  PHQ-9 Score '21 21 19  '$ Difficult doing work/chores Extremely dIfficult Very difficult Extremely dIfficult    -----------------------------------------------------------------------------------------    Medications: Outpatient Medications Prior to Visit  Medication Sig   Cholecalciferol 25 MCG (1000 UT) tablet Take 1 tablet by mouth daily.   clonazePAM (KLONOPIN) 0.5 MG tablet Take 1 tablet (0.5 mg total) by mouth 2 (two) times daily as needed (severe anxiety).   COLLAGEN PO  Take 1 tablet by mouth daily.   Multiple Vitamin (MULTIVITAMIN) tablet Take 1 tablet by mouth daily.   Nutritional Supplements (DHEA PO) Take by mouth.   SUMAtriptan (IMITREX) 50 MG tablet TAKE 1 TABLET BY MOUTH ONCE DAILY FOR ONE DOSE. MAY REPEAT IN 2 HOURS IF HEADACHE PERSISTS OR RECURS   [DISCONTINUED] FLUoxetine (PROZAC) 20 MG capsule Take 2 capsules (40 mg total) by mouth daily. (Patient not taking: Reported on 03/17/2022)   No facility-administered medications prior to visit.    Review of Systems  Constitutional:  Positive for fatigue. Negative for fever.  Respiratory:  Negative for cough and  shortness of breath.   Cardiovascular:  Negative for chest pain and leg swelling.  Gastrointestinal:  Negative for abdominal pain.  Neurological:  Negative for dizziness and headaches.  Psychiatric/Behavioral:  Positive for sleep disturbance. The patient is nervous/anxious.       Objective    Ht '5\' 4"'$  (1.626 m)   Wt 219 lb (99.3 kg)   BMI 37.59 kg/m    Physical Exam Constitutional:      Appearance: She is not ill-appearing or toxic-appearing.  Neurological:     Mental Status: She is oriented to person, place, and time.  Psychiatric:        Mood and Affect: Mood is anxious. Affect is tearful.        Behavior: Behavior normal.        Assessment & Plan     Problem List Items Addressed This Visit       Other   Polymenorrhea    Encouraged pt to make appt with gyn She reports trying several OCPs, IUD options without tolerating any  Advised pt to complete a cbc, iron panel, d/t frequent bleeding      Relevant Orders   CBC w/Diff/Platelet   Comprehensive Metabolic Panel (CMET)   Iron, TIBC and Ferritin Panel   PMDD (premenstrual dysphoric disorder) - Primary    Unclear if this is the underlying dx. Pt did not follow through with last psychiatrist.  Pt was taking prozac but self d/c - caused sexual dysfunction Suggested trying effexor 37.5 mg XR-- after one week can increase to 75 mg.  Encouraged her to get back into therapy Fu 6 weeks.       Relevant Medications   venlafaxine XR (EFFEXOR XR) 37.5 MG 24 hr capsule   Other Relevant Orders   CBC w/Diff/Platelet   Comprehensive Metabolic Panel (CMET)     Return in about 6 weeks (around 04/28/2022) for anxiety, depression.     I discussed the assessment and treatment plan with the patient. The patient was provided an opportunity to ask questions and all were answered. The patient agreed with the plan and demonstrated an understanding of the instructions.   The patient was advised to call back or seek an in-person  evaluation if the symptoms worsen or if the condition fails to improve as anticipated.  I provided 15 minutes of non-face-to-face time during this encounter.  I, [name], PA-C have reviewed all documentation for this visit. The documentation on  03/17/22  for the exam, diagnosis, procedures, and orders are all accurate and complete.  [name], PA-C [facility] [address] [place], [place], [address] Office: [phone] Fax: [place]

## 2022-04-11 ENCOUNTER — Telehealth: Payer: Self-pay | Admitting: Obstetrics and Gynecology

## 2022-04-11 NOTE — Telephone Encounter (Signed)
Left message for patient to call office back to r/s appt on 05/11/22 with Dr Marcelline Mates. She will be on vacation

## 2022-04-11 NOTE — Telephone Encounter (Signed)
The patient called back, I was unable to rescheduled patient with Dr Marcelline Mates. The patient was good with seeing Dr. Hulan Fray on 4/16 at 9:15

## 2022-04-19 ENCOUNTER — Encounter: Payer: Self-pay | Admitting: Physician Assistant

## 2022-04-19 ENCOUNTER — Ambulatory Visit (INDEPENDENT_AMBULATORY_CARE_PROVIDER_SITE_OTHER): Payer: BC Managed Care – PPO | Admitting: Physician Assistant

## 2022-04-19 VITALS — HR 70 | Temp 98.1°F | Ht 64.0 in | Wt 221.5 lb

## 2022-04-19 DIAGNOSIS — Z0000 Encounter for general adult medical examination without abnormal findings: Secondary | ICD-10-CM | POA: Diagnosis not present

## 2022-04-19 DIAGNOSIS — N920 Excessive and frequent menstruation with regular cycle: Secondary | ICD-10-CM

## 2022-04-19 DIAGNOSIS — M542 Cervicalgia: Secondary | ICD-10-CM | POA: Diagnosis not present

## 2022-04-19 DIAGNOSIS — F3281 Premenstrual dysphoric disorder: Secondary | ICD-10-CM

## 2022-04-19 MED ORDER — BACLOFEN 10 MG PO TABS: 10.0000 mg | ORAL_TABLET | Freq: Three times a day (TID) | ORAL | 0 refills | Status: DC

## 2022-04-19 NOTE — Assessment & Plan Note (Signed)
Pt has appt with gyn upcoming Re-ordered cbc, iron panel

## 2022-04-19 NOTE — Assessment & Plan Note (Addendum)
Pt back to prozac 20 mg Discussed I can add wellbutrin if her sexual se return. Suspicion for mood disorder other than PMDD but pt hesitant to see psychiatry.  Any need for another primary medication may choose vraylar next.

## 2022-04-19 NOTE — Progress Notes (Signed)
I,[name],acting as a Education administrator for [employer], PA-C.,have documented all relevant documentation on the behalf of Haley Kirschner, PA-C,as directed by  Haley Kirschner, PA-C while in the presence of Haley Kirschner, PA-C.   Complete physical exam   Patient: Haley Moore   DOB: [birth year]   47 y.o. Female  MRN: [id number] Visit Date: 04/19/2022  Today's healthcare provider: [name], PA-C   Cc. 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   Pt reports trying the Effexor for a month and she reports not feeling different, went back to the Prozac a few days ago. She has not noticed a difference yet. She is apprehensive has she had an effect to her libido last time.  Past Medical History:  Diagnosis Date   Allergy    Anxiety    Depression    Dysmenorrhea    Malignant melanoma of skin of chest ([place])    2018   Menorrhagia    Migraine    Past Surgical History:  Procedure Laterality Date   BREAST ENHANCEMENT SURGERY     LAPAROSCOPIC TUBAL LIGATION N/A 10/25/2016   Procedure: LAPAROSCOPIC TUBAL LIGATION;  Surgeon: Malachy Mood, MD;  Location: [facility] ORS;  Service: Gynecology;  Laterality: N/A;   SKIN CANCER EXCISION     WRIST SURGERY     Social History   Socioeconomic History   Marital status: Married    Spouse name: Not on file   Number of children: 0   Years of education: Not on file   Highest education level: 12th grade  Occupational History   Not on file  Tobacco Use   Smoking status: Former    Types: Cigarettes    Quit date: 02/07/1998    Years since quitting: 24.2   Smokeless tobacco: Never   Tobacco comments:    1 PACK WEEKLY  Vaping Use   Vaping Use: Never used  Substance and Sexual Activity   Alcohol use: Yes    Alcohol/week: 0.0 standard drinks of alcohol    Comment: RARE   Drug use: No   Sexual activity: Yes    Birth control/protection: None, Surgical    Comment: Tubal  ligation  Other Topics Concern   Not on file  Social History Narrative   ** Merged History Encounter **       Social Determinants of Health   Financial Resource Strain: Low Risk  (06/04/2020)   Overall Financial Resource Strain (CARDIA)    Difficulty of Paying Living Expenses: Not hard at all  Food Insecurity: No Food Insecurity (06/04/2020)   Hunger Vital Sign    Worried About Running Out of Food in the Last Year: Never true    Ran Out of Food in the Last Year: Never true  Transportation Needs: No Transportation Needs (06/04/2020)   PRAPARE - Hydrologist (Medical): No    Lack of Transportation (Non-Medical): No  Physical Activity: Sufficiently Active (06/04/2020)   Exercise Vital Sign    Days of Exercise per Week: 3 days    Minutes of Exercise per Session: 60 min  Stress: Stress Concern Present (06/04/2020)   [place]    Feeling of Stress : Very much  Social Connections: Unknown (06/04/2020)   Social Connection and Isolation Panel [NHANES]    Frequency of Communication with Friends and Family: Three times a week  Frequency of Social Gatherings with Friends and Family: Patient refused    Attends Religious Services: Patient refused    Marine scientist or Organizations: No    Attends Music therapist: Not on file    Marital Status: Married  Human resources officer Violence: Not on file   Family Status  Relation Name Status   Mother  Alive   Father  Alive   Sister  Alive   Brother 1 Alive   Brother 2 Alive   Brother 3 Alive       anxiety/panic attacks   MGM  Deceased   Family History  Problem Relation Age of Onset   Heart disease Mother    Uterine cancer Maternal Grandmother 60       vs ovar ca   No Active Allergies  Patient Care Team: Haley Kirschner, PA-C as PCP - General (Physician Assistant) Lucille Passy, MD (Inactive) (Family Medicine)    Medications: Outpatient Medications Prior to Visit  Medication Sig   Cholecalciferol 25 MCG (1000 UT) tablet Take 1 tablet by mouth daily.   clonazePAM (KLONOPIN) 0.5 MG tablet Take 1 tablet (0.5 mg total) by mouth 2 (two) times daily as needed (severe anxiety).   COLLAGEN PO Take 1 tablet by mouth daily.   FLUoxetine (PROZAC) 20 MG capsule Take 20 mg by mouth daily.   Multiple Vitamin (MULTIVITAMIN) tablet Take 1 tablet by mouth daily.   Nutritional Supplements (DHEA PO) Take by mouth.   SUMAtriptan (IMITREX) 50 MG tablet TAKE 1 TABLET BY MOUTH ONCE DAILY FOR ONE DOSE. MAY REPEAT IN 2 HOURS IF HEADACHE PERSISTS OR RECURS   [DISCONTINUED] venlafaxine XR (EFFEXOR XR) 37.5 MG 24 hr capsule Take 1 capsule (37.5 mg total) by mouth daily with breakfast. (Patient not taking: Reported on 04/19/2022)   No facility-administered medications prior to visit.    Review of Systems  Constitutional:  Negative for fatigue and fever.  Respiratory:  Negative for cough and shortness of breath.   Cardiovascular:  Negative for chest pain and leg swelling.  Gastrointestinal:  Negative for abdominal pain.  Neurological:  Negative for dizziness and headaches.  Psychiatric/Behavioral:  Positive for agitation and behavioral problems. The patient is nervous/anxious.      Objective    Pulse 70   Temp 98.1 F (36.7 C)   Ht '5\' 4"'$  (1.626 m)   Wt 221 lb 8 oz (100.5 kg)   SpO2 98%   BMI 38.02 kg/m    Physical Exam Constitutional:      General: She is awake.     Appearance: She is well-developed. She is not ill-appearing.  HENT:     Head: Normocephalic.     Right Ear: Tympanic membrane normal.     Left Ear: Tympanic membrane normal.     Nose: Nose normal. No congestion or rhinorrhea.     Mouth/Throat:     Pharynx: No oropharyngeal exudate or posterior oropharyngeal erythema.  Eyes:     Conjunctiva/sclera: Conjunctivae normal.     Pupils: Pupils are equal, round, and reactive to light.  Neck:      Thyroid: No thyroid mass or thyromegaly.  Cardiovascular:     Rate and Rhythm: Normal rate and regular rhythm.     Heart sounds: Normal heart sounds.  Pulmonary:     Effort: Pulmonary effort is normal.     Breath sounds: Normal breath sounds.  Abdominal:     Palpations: Abdomen is soft.     Tenderness: There is no  abdominal tenderness.  Musculoskeletal:     Right lower leg: No swelling. No edema.     Left lower leg: No swelling. No edema.  Lymphadenopathy:     Cervical: No cervical adenopathy.  Skin:    General: Skin is warm.  Neurological:     Mental Status: She is alert and oriented to person, place, and time.  Psychiatric:        Attention and Perception: Attention normal.        Mood and Affect: Mood normal.        Speech: Speech normal.        Behavior: Behavior normal. Behavior is cooperative.     Last depression screening scores    04/19/2022    1:25 PM 03/17/2022    1:04 PM 09/06/2021    1:17 PM  PHQ 2/9 Scores  PHQ - 2 Score '6 6 6  '$ PHQ- 9 Score '25 21 21   '$ Last fall risk screening    04/19/2022    1:25 PM  Fall Risk   Falls in the past year? 0  Number falls in past yr: 0  Injury with Fall? 0  Risk for fall due to : No Fall Risks  Follow up Falls evaluation completed   Last Audit-C alcohol use screening    04/19/2022    1:24 PM  Alcohol Use Disorder Test (AUDIT)  1. How often do you have a drink containing alcohol? 2  2. How many drinks containing alcohol do you have on a typical day when you are drinking? 0  3. How often do you have six or more drinks on one occasion? 0  AUDIT-C Score 2   A score of 3 or more in women, and 4 or more in men indicates increased risk for alcohol abuse, EXCEPT if all of the points are from question 1   No results found for any visits on 04/19/22.  Assessment & Plan    Routine Health Maintenance and Physical Exam  Exercise Activities and Dietary recommendations --balanced diet high in fiber and protein, low in sugars,  carbs, fats. --physical activity/exercise 30 minutes 3-5 times a week     There is no immunization history on file for this patient.  Health Maintenance  Topic Date Due   COVID-19 Vaccine (1) Never done   HIV Screening  Never done   MAMMOGRAM  Never done   Hepatitis C Screening  Never done   DTaP/Tdap/Td (1 - Tdap) Never done   Fecal DNA (Cologuard)  Never done   PAP SMEAR-Modifier  08/17/2021   INFLUENZA VACCINE  05/08/2022 (Originally 09/07/2021)   HPV VACCINES  Aged Out    Discussed health benefits of physical activity, and encouraged her to engage in regular exercise appropriate for her age and condition.  Problem List Items Addressed This Visit       Other   Polymenorrhea    Pt has appt with gyn upcoming Re-ordered cbc, iron panel      Relevant Orders   CBC w/Diff/Platelet   Iron, TIBC and Ferritin Panel   TSH + free T4   PMDD (premenstrual dysphoric disorder)    Pt back to prozac 20 mg Discussed I can add wellbutrin if her sexual se return. Suspicion for mood disorder other than PMDD but pt hesitant to see psychiatry.  Any need for another primary medication may choose vraylar next.      Relevant Medications   FLUoxetine (PROZAC) 20 MG capsule   Other Visit Diagnoses  Annual physical exam    -  Primary   Relevant Orders   CBC w/Diff/Platelet   Comprehensive Metabolic Panel (CMET)   Iron, TIBC and Ferritin Panel   Lipid Profile   HgB A1c   TSH + free T4   Neck pain       Relevant Medications   baclofen (LIORESAL) 10 MG tablet   Other Relevant Orders   Ambulatory referral to Physical Therapy      Encouraged pt to complete cologuard. Pt has upcoming appt with gyn for pap.  Return if symptoms worsen or fail to improve, for anxiety, depression.     I, Haley Kirschner, PA-C have reviewed all documentation for this visit. The documentation on  04/19/22  for the exam, diagnosis, procedures, and orders are all accurate and complete.  Haley Kirschner,  PA-C [facility] [address] [place], [place], [address] Office: [phone] Fax: [place]

## 2022-04-20 LAB — LIPID PANEL
Chol/HDL Ratio: 3.2 ratio (ref 0.0–4.4)
Cholesterol, Total: 190 mg/dL (ref 100–199)
HDL: 60 mg/dL (ref >39)
LDL Chol Calc (NIH): 110 mg/dL — ABNORMAL HIGH (ref 0–99)
Triglycerides: 110 mg/dL (ref 0–149)
VLDL Cholesterol Cal: 20 mg/dL (ref 5–40)

## 2022-04-20 LAB — COMPREHENSIVE METABOLIC PANEL
ALT: 15 IU/L (ref 0–32)
AST: 15 IU/L (ref 0–40)
Albumin/Globulin Ratio: 1.8 (ref 1.2–2.2)
Albumin: 4.2 g/dL (ref 3.9–4.9)
Alkaline Phosphatase: 21 IU/L — ABNORMAL LOW (ref 44–121)
BUN/Creatinine Ratio: 17 (ref 9–23)
BUN: 15 mg/dL (ref 6–24)
Bilirubin Total: 0.2 mg/dL (ref 0.0–1.2)
CO2: 25 mmol/L (ref 20–29)
Calcium: 9.5 mg/dL (ref 8.7–10.2)
Chloride: 102 mmol/L (ref 96–106)
Creatinine, Ser: 0.89 mg/dL (ref 0.57–1.00)
Globulin, Total: 2.3 g/dL (ref 1.5–4.5)
Glucose: 99 mg/dL (ref 70–99)
Potassium: 4.2 mmol/L (ref 3.5–5.2)
Sodium: 140 mmol/L (ref 134–144)
Total Protein: 6.5 g/dL (ref 6.0–8.5)
eGFR: 80 mL/min/{1.73_m2} (ref >59)

## 2022-04-20 LAB — CBC WITH DIFFERENTIAL/PLATELET
Basophils Absolute: 0 10*3/uL (ref 0.0–0.2)
Basos: 1 %
EOS (ABSOLUTE): 0.1 10*3/uL (ref 0.0–0.4)
Eos: 1 %
Hematocrit: 39.1 % (ref 34.0–46.6)
Hemoglobin: 13.5 g/dL (ref 11.1–15.9)
Immature Grans (Abs): 0 10*3/uL (ref 0.0–0.1)
Immature Granulocytes: 0 %
Lymphocytes Absolute: 1.7 10*3/uL (ref 0.7–3.1)
Lymphs: 27 %
MCH: 32.5 pg (ref 26.6–33.0)
MCHC: 34.5 g/dL (ref 31.5–35.7)
MCV: 94 fL (ref 79–97)
Monocytes Absolute: 0.4 10*3/uL (ref 0.1–0.9)
Monocytes: 7 %
Neutrophils Absolute: 3.9 10*3/uL (ref 1.4–7.0)
Neutrophils: 64 %
Platelets: 293 10*3/uL (ref 150–450)
RBC: 4.15 x10E6/uL (ref 3.77–5.28)
RDW: 12.1 % (ref 11.7–15.4)
WBC: 6 10*3/uL (ref 3.4–10.8)

## 2022-04-20 LAB — HEMOGLOBIN A1C
Est. average glucose Bld gHb Est-mCnc: 111 mg/dL
Hgb A1c MFr Bld: 5.5 % (ref 4.8–5.6)

## 2022-04-20 LAB — IRON,TIBC AND FERRITIN PANEL
Ferritin: 70 ng/mL (ref 15–150)
Iron Saturation: 22 % (ref 15–55)
Iron: 67 ug/dL (ref 27–159)
Total Iron Binding Capacity: 298 ug/dL (ref 250–450)
UIBC: 231 ug/dL (ref 131–425)

## 2022-04-20 LAB — TSH+FREE T4
Free T4: 0.88 ng/dL (ref 0.82–1.77)
TSH: 1.35 u[IU]/mL (ref 0.450–4.500)

## 2022-04-21 ENCOUNTER — Encounter: Payer: Self-pay | Admitting: Physician Assistant

## 2022-04-28 ENCOUNTER — Ambulatory Visit: Payer: BC Managed Care – PPO | Admitting: Physician Assistant

## 2022-04-28 VITALS — BP 106/80 | HR 69 | Wt 216.2 lb

## 2022-04-28 DIAGNOSIS — F3281 Premenstrual dysphoric disorder: Secondary | ICD-10-CM

## 2022-04-28 NOTE — Assessment & Plan Note (Addendum)
Pt reports feeling much better, continue prozac 20 mg Advised if sexual se come up, to lmk and I would send in wellbutrin 150 mg

## 2022-04-28 NOTE — Patient Instructions (Signed)
[facility] [address] [place], [place] [address]  Phone: [phone] Email: [email or web address] 

## 2022-04-28 NOTE — Progress Notes (Signed)
I,[name],acting as a Education administrator for [employer], PA-C.,have documented all relevant documentation on the behalf of [name], PA-C,as directed by  [name], PA-C while in the presence of [name], PA-C.   Established patient visit   Patient: Haley Moore   DOB: Aug 27, 1975   47 y.o. Female  MRN: [id number] Visit Date: 04/28/2022  Today's healthcare provider: [name], PA-C   Cc. Anxiety and depression  Subjective    HPI  Anxiety and Depression, Follow-up  She was last seen for anxiety 6 weeks ago. Changes made at last visit include none.   She reports excellent compliance with treatment. She reports excellent tolerance of treatment. She is having side effects. Worried about low sex drive, but denies it occurring yet  She feels her anxiety is mild and Improved since last visit.  Symptoms: No chest pain Yes difficulty concentrating  No dizziness Yes fatigue  Yes feelings of losing control No insomnia  No irritable No palpitations  No panic attacks No racing thoughts  No shortness of breath No sweating  No tremors/shakes    GAD-7 Results    04/28/2022   10:43 AM 03/17/2022    1:02 PM 09/06/2021    1:40 PM  GAD-7 Generalized Anxiety Disorder Screening Tool  1. Feeling Nervous, Anxious, or on Edge 1 3 3   2. Not Being Able to Stop or Control Worrying 2 3 3   3. Worrying Too Much About Different Things 2 3 3   4. Trouble Relaxing 1 3 2   5. Being So Restless it's Hard To Sit Still 0 3 1  6. Becoming Easily Annoyed or Irritable 3 3 3   7. Feeling Afraid As If Something Awful Might Happen 2 3 1   Total GAD-7 Score 11 21 16   Difficulty At Work, Home, or Getting  Along With Others? Extremely difficult Extremely difficult Very difficult   ---------------------------------------------------------------------------------------------------  Medications: Outpatient Medications Prior to Visit  Medication Sig   baclofen (LIORESAL) 10 MG tablet  Take 1 tablet (10 mg total) by mouth 3 (three) times daily.   Cholecalciferol 25 MCG (1000 UT) tablet Take 1 tablet by mouth daily.   clonazePAM (KLONOPIN) 0.5 MG tablet Take 1 tablet (0.5 mg total) by mouth 2 (two) times daily as needed (severe anxiety).   COLLAGEN PO Take 1 tablet by mouth daily.   Multiple Vitamin (MULTIVITAMIN) tablet Take 1 tablet by mouth daily.   Nutritional Supplements (DHEA PO) Take by mouth.   SUMAtriptan (IMITREX) 50 MG tablet TAKE 1 TABLET BY MOUTH ONCE DAILY FOR ONE DOSE. MAY REPEAT IN 2 HOURS IF HEADACHE PERSISTS OR RECURS   FLUoxetine (PROZAC) 20 MG capsule Take 20 mg by mouth daily. (Patient not taking: Reported on 04/28/2022)   No facility-administered medications prior to visit.   Review of Systems  Constitutional:  Negative for fatigue and fever.  Respiratory:  Negative for cough and shortness of breath.   Cardiovascular:  Negative for chest pain and leg swelling.  Gastrointestinal:  Negative for abdominal pain.  Neurological:  Negative for dizziness and headaches.      Objective    BP 106/80 (BP Location: Right Arm, Patient Position: Sitting, Cuff Size: Large)   Pulse 69   Wt 216 lb 3.2 oz (98.1 kg)   SpO2 98%   BMI 37.11 kg/m   Physical Exam Vitals reviewed.  Constitutional:      Appearance: She is not ill-appearing.  HENT:     Head: Normocephalic.  Eyes:  Conjunctiva/sclera: Conjunctivae normal.  Cardiovascular:     Rate and Rhythm: Normal rate.  Pulmonary:     Effort: Pulmonary effort is normal. No respiratory distress.  Neurological:     General: No focal deficit present.     Mental Status: She is alert and oriented to person, place, and time.  Psychiatric:        Mood and Affect: Mood normal.        Behavior: Behavior normal.      No results found for any visits on 04/28/22.  Assessment & Plan     Problem List Items Addressed This Visit       Other   PMDD (premenstrual dysphoric disorder) - Primary    Pt reports  feeling much better, continue prozac 20 mg Advised if sexual se come up, to lmk and I would send in wellbutrin 150 mg          Return in about 6 months (around 10/29/2022) for chronic conditions.      I, [name], PA-C have reviewed all documentation for this visit. The documentation on  04/28/22  for the exam, diagnosis, procedures, and orders are all accurate and complete.  [name], PA-C [facility] [address] [place], [place], [address] Office: [phone] Fax: [place]

## 2022-05-03 ENCOUNTER — Other Ambulatory Visit: Payer: Self-pay | Admitting: Physician Assistant

## 2022-05-03 DIAGNOSIS — Z1231 Encounter for screening mammogram for malignant neoplasm of breast: Secondary | ICD-10-CM

## 2022-05-10 ENCOUNTER — Ambulatory Visit
Admission: RE | Admit: 2022-05-10 | Discharge: 2022-05-10 | Disposition: A | Discharge status: Home / Self Care | Payer: BC Managed Care – PPO | Source: Ambulatory Visit | Attending: Physician Assistant | Admitting: Physician Assistant

## 2022-05-10 DIAGNOSIS — Z1231 Encounter for screening mammogram for malignant neoplasm of breast: Secondary | ICD-10-CM

## 2022-05-11 ENCOUNTER — Ambulatory Visit: Payer: Self-pay | Admitting: Obstetrics and Gynecology

## 2022-05-24 ENCOUNTER — Encounter: Payer: Self-pay | Admitting: Obstetrics & Gynecology

## 2022-05-24 ENCOUNTER — Ambulatory Visit: Payer: BC Managed Care – PPO | Admitting: Obstetrics & Gynecology

## 2022-05-24 ENCOUNTER — Other Ambulatory Visit (HOSPITAL_COMMUNITY)
Admission: RE | Admit: 2022-05-24 | Discharge: 2022-05-24 | Disposition: A | Discharge status: Home / Self Care | Payer: BC Managed Care – PPO | Source: Ambulatory Visit | Attending: Obstetrics & Gynecology | Admitting: Obstetrics & Gynecology

## 2022-05-24 VITALS — BP 125/73 | HR 88 | Ht 64.0 in | Wt 203.0 lb

## 2022-05-24 DIAGNOSIS — R413 Other amnesia: Secondary | ICD-10-CM

## 2022-05-24 DIAGNOSIS — N930 Postcoital and contact bleeding: Secondary | ICD-10-CM | POA: Insufficient documentation

## 2022-05-24 DIAGNOSIS — N946 Dysmenorrhea, unspecified: Secondary | ICD-10-CM

## 2022-05-24 DIAGNOSIS — R5383 Other fatigue: Secondary | ICD-10-CM

## 2022-05-24 DIAGNOSIS — Z124 Encounter for screening for malignant neoplasm of cervix: Secondary | ICD-10-CM

## 2022-05-24 NOTE — Progress Notes (Signed)
    GYNECOLOGY PROGRESS NOTE  Subjective:    Patient ID: Haley Moore, female    DOB: 07/17/1975, 47 y.o.   MRN: [id number]  HPI  Patient is a 47 y.o. married G0P0000 female who presents for " a surgical consult". She has seen Dr. Valentino Saxon in the past and tells me that she thought that I was a surgeon here at this practice.  She has multiple concerns:   1) fatigue- her HBG was normal last month, 13. Her TSH was 1.3  2) need for pap smear. Last pap was 2018  3) lifetime of periods q 3 weeks with associated "PMS" symptoms the week prior to her period.  4) cramping with her periods. She has tried essentially all hormonal forms of contraception and none did well with her. She eventually had a BTL but this obviously did not help her cramping.  5). Some dyspareunia and some postcoital bleeding. Her husband is 10 years younger than her and they have an active sex life. They have been married for 2 years.  6) inability to focus and short term memory issues. She believes that she has ADHD. She saw a psychiatrist but did not feel that this particular person was helpful. She is willing to see another psychiatrist.  The following portions of the patient's history were reviewed and updated as appropriate: allergies, current medications, past family history, past medical history, past social history, past surgical history, and problem list.  Review of Systems Pertinent items are noted in HPI.   Objective:   Blood pressure 125/73, pulse 88, height  (1.626 m), weight 203 lb (92.1 kg), last menstrual period 05/22/2022. Body mass index is 34.84 kg/m. Well nourished, well hydrated White female, no apparent distress Her conversation style leads me to suspect some psychiatric diagnosis. I explained this thought to her and told her that I think a consult with a psychiatrist would be more valuable than a consult with a neurologist at this time. She is ambulating normally. EG- normal Cervix-  nulliparous, no CMT Uterus- upper limits of normal size, mobile, no adnexal masses or tenderness  Assessment:   1. Screening for cervical cancer   2. Postcoital bleeding   3. Irregular periods   4. Fatigue, unspecified type   5. Memory loss      Plan:   1. Screening for cervical cancer  - Cytology - PAP with HPV cotesting  2. Postcoital bleeding  - Cytology - PAP - cervical cultures with pap smear   4. Fatigue, unspecified type  - Vitamin D (25 hydroxy) - B12  5. Memory loss and inability to focus  - Ambulatory referral to Psychiatry  6. Her desire to discuss possible "surgery" with Dr. Valentino Saxon - she will schedule an appt with Dr. Valentino Saxon at her convenience  - gyn ultrasound ordered

## 2022-05-25 LAB — VITAMIN B12: Vitamin B-12: 694 pg/mL (ref 232–1245)

## 2022-05-25 LAB — VITAMIN D 25 HYDROXY (VIT D DEFICIENCY, FRACTURES): Vit D, 25-Hydroxy: 40.4 ng/mL (ref 30.0–100.0)

## 2022-05-26 LAB — CYTOLOGY - PAP
Chlamydia: NEGATIVE
Comment: NEGATIVE
Comment: NEGATIVE
Comment: NORMAL
Diagnosis: NEGATIVE
High risk HPV: NEGATIVE
Neisseria Gonorrhea: NEGATIVE

## 2022-05-30 ENCOUNTER — Ambulatory Visit (INDEPENDENT_AMBULATORY_CARE_PROVIDER_SITE_OTHER): Payer: BC Managed Care – PPO

## 2022-05-30 DIAGNOSIS — R102 Pelvic and perineal pain: Secondary | ICD-10-CM | POA: Diagnosis not present

## 2022-06-16 ENCOUNTER — Telehealth: Payer: Self-pay

## 2022-06-16 NOTE — Telephone Encounter (Signed)
Patient called into triage line, yesterday she was at work and she was having so much pain, very nausea, lower back pain. Patient isn't bleeding. Doesn't want to go to the ER due to the cost. She took Tylenol last night didn't help and she did have some muscle relaxer's and took one to help her sleep.  She did have an imaging done on 05/30/22 which shows some cyst.    Please advise

## 2022-06-28 NOTE — Progress Notes (Unsigned)
    GYNECOLOGY PROGRESS NOTE  Subjective:    Patient ID: Haley Moore, female    DOB: 03-15-1975, 47 y.o.   MRN: [id number]  HPI  Patient is a 47 y.o. G0P0000 female who presents for consultation for surgery for pelvic pain. She was evaluated by Dr. Marice Potter during that time she had concerns about  lifetime of periods q 3 weeks with associated "PMS" symptoms the week prior to her period and cramping with her periods. She has tried essentially all hormonal forms of contraception and none did well with her. She eventually had a BTL but this obviously did not help her cramping. Some dyspareunia and some postcoital bleeding. Her husband is 10 years younger than her and they have an active sex life. They have been married for 2 years.   {Common ambulatory SmartLinks:19316}  Review of Systems {ros; complete:30496}   Objective:   There were no vitals taken for this visit. There is no height or weight on file to calculate BMI. General appearance: {general exam:16600} Abdomen: {abdominal exam:16834} Pelvic: {pelvic exam:16852::"cervix normal in appearance","external genitalia normal","no adnexal masses or tenderness","no cervical motion tenderness","rectovaginal septum normal","uterus normal size, shape, and consistency","vagina normal without discharge"} Extremities: {extremity exam:5109} Neurologic: {neuro exam:17854}   Assessment:   No diagnosis found.   Plan:   There are no diagnoses linked to this encounter.    Haley Laser, MD [facility]

## 2022-06-29 ENCOUNTER — Ambulatory Visit: Payer: BC Managed Care – PPO | Admitting: Obstetrics and Gynecology

## 2022-06-29 ENCOUNTER — Encounter: Payer: Self-pay | Admitting: Obstetrics and Gynecology

## 2022-06-29 VITALS — BP 107/79 | HR 84 | Resp 16 | Ht 64.0 in | Wt 204.2 lb

## 2022-06-29 DIAGNOSIS — N808 Other endometriosis: Secondary | ICD-10-CM | POA: Diagnosis not present

## 2022-06-29 DIAGNOSIS — G43009 Migraine without aura, not intractable, without status migrainosus: Secondary | ICD-10-CM

## 2022-06-29 DIAGNOSIS — N946 Dysmenorrhea, unspecified: Secondary | ICD-10-CM

## 2022-06-29 DIAGNOSIS — N921 Excessive and frequent menstruation with irregular cycle: Secondary | ICD-10-CM

## 2022-06-29 DIAGNOSIS — Z01818 Encounter for other preprocedural examination: Secondary | ICD-10-CM

## 2022-06-29 DIAGNOSIS — N83202 Unspecified ovarian cyst, left side: Secondary | ICD-10-CM

## 2022-06-29 DIAGNOSIS — N930 Postcoital and contact bleeding: Secondary | ICD-10-CM | POA: Diagnosis not present

## 2022-06-29 DIAGNOSIS — N83201 Unspecified ovarian cyst, right side: Secondary | ICD-10-CM

## 2022-06-29 DIAGNOSIS — D259 Leiomyoma of uterus, unspecified: Secondary | ICD-10-CM

## 2022-06-29 DIAGNOSIS — R102 Pelvic and perineal pain: Secondary | ICD-10-CM

## 2022-06-29 MED ORDER — ORILISSA 200 MG PO TABS: 1.0000 | ORAL_TABLET | Freq: Two times a day (BID) | ORAL | 5 refills | Status: DC

## 2022-06-29 MED ORDER — NURTEC 75 MG PO TBDP: 1.0000 | ORAL_TABLET | Freq: Once | ORAL | 1 refills | Status: DC | PRN

## 2022-07-15 ENCOUNTER — Telehealth: Payer: Self-pay

## 2022-07-15 NOTE — Telephone Encounter (Signed)
Please see if patient recently increased dosing of her Haley Moore as sometimes that can happen. Otherwise, can hold the Bunch for now to see if her symptoms resolve. If they do, we will have need try a different medication.

## 2022-07-15 NOTE — Telephone Encounter (Signed)
Pt calling; has question about her medications possibly interacting with each other.  [phone]  Pt states she is on the 2nd week of orilissa and mounjaro for four months; this weeek she started experiencing real bad nausea, sweating like crazy, tired, short of breath to the point she is unable to go to work.  Adv will send msg to [facility].

## 2022-07-15 NOTE — Telephone Encounter (Signed)
Pt aware.

## 2022-08-08 NOTE — Progress Notes (Signed)
GYNECOLOGY PROGRESS NOTE  Subjective:    Patient ID: Haley Moore, female    DOB: 1976/01/09, 47 y.o.   MRN: [id number]  HPI  Patient is a 47 y.o. G0P0000 female who presents for medication follow up. She was given samples of Orilissa to help manage her suspected endometriosis symptoms (pelvic pain dysmenorrhea, dyspareunia, and postcoital bleeding). Today she reports that she stopped taking the [place] after just a few weeks because it made her have hot flashes and vomiting while also taking Mounjaro.  She presents today to discuss other options however does report that since her last visit her cycles have not been as bad as they had been in the past.  Her cycles are much lighter and pain is better (has gone from pain scale of 8 down to approximately 2 or 3), and she no longer has back pain.    The following portions of the patient's history were reviewed and updated as appropriate: allergies, current medications, past family history, past medical history, past social history, past surgical history, and problem list.  Review of Systems A comprehensive review of systems was negative except for: Constitutional: positive for low energy.  Notes that she is part of a weight loss program with the use of the [facility] and received an initial B12 injection at the beginning of treatment approximately 4 to 5 months ago, however is dealing with low energy again at this time.   Objective:   Blood pressure 105/68, pulse 93, resp. rate 16, height 5\' 4"  (1.626 m), weight 195 lb 14.4 oz (88.9 kg), last menstrual period 08/06/2022. Body mass index is 33.63 kg/m.  General appearance: alert, cooperative, and no distress Remainder of exam deferred   Assessment:   1. Dysmenorrhea   2. Pelvic pain   3. Postcoital bleeding   4. Low energy   5. Weight loss due to medication      Plan:   Suspected endometriosis - other diagnoses not confirmed, patient does note that symptoms have improved.   Unclear whether symptoms have improved due to short term use of Orilissa or whether weight loss has associated an improvement in her symptoms as she notes she has lost approximately 30 pounds over the past 3 to 4 months as weight loss can also help with management of menstrual symptoms and pelvic pain.  Patient notes that she is also began exercising which has been helpful as well.  Discussed that at this time patient can manage symptoms expectantly as they have improved significantly without further intervention, however if symptoms do return can consider other options in the future.  Patient can follow-up at that time if symptoms worsen. Low energy - offered B12 injection today for low energy as she had received previously for weight loss.  Patient okay to receive today. Weight loss due to medication -patient currently on Mounjaro.  Knows that she is receiving it from a weight loss clinic however is having to pay approximately $600 a month.  Notes that she has a friend who receives medication from a different clinic however that medication is compounded.  Wonders if this could be done for her as she has heard that it could be cheaper as well as approximately $200 well.  Advised that an attempt could be made to see if compounding pharmacy would be an option for her.  Call or return to clinic prn if these symptoms worsen or fail to improve as anticipated.    A total of 25 minutes were spent face-to-face  with the patient during this encounter and over half of that time involved counseling and coordination of care.    [name], MD [facility]

## 2022-08-10 ENCOUNTER — Ambulatory Visit: Payer: BC Managed Care – PPO | Admitting: Obstetrics and Gynecology

## 2022-08-10 VITALS — BP 105/68 | HR 93 | Resp 16 | Ht 64.0 in | Wt 195.9 lb

## 2022-08-10 DIAGNOSIS — N930 Postcoital and contact bleeding: Secondary | ICD-10-CM

## 2022-08-10 DIAGNOSIS — N946 Dysmenorrhea, unspecified: Secondary | ICD-10-CM

## 2022-08-10 DIAGNOSIS — R102 Pelvic and perineal pain: Secondary | ICD-10-CM

## 2022-08-10 DIAGNOSIS — T50905A Adverse effect of unspecified drugs, medicaments and biological substances, initial encounter: Secondary | ICD-10-CM | POA: Diagnosis not present

## 2022-08-10 DIAGNOSIS — R5383 Other fatigue: Secondary | ICD-10-CM | POA: Diagnosis not present

## 2022-08-10 DIAGNOSIS — R634 Abnormal weight loss: Secondary | ICD-10-CM

## 2022-08-10 MED ORDER — CYANOCOBALAMIN 1000 MCG/ML IJ SOLN
1000.0000 ug | Freq: Once | INTRAMUSCULAR | Status: AC
Start: 2022-08-10 — End: 2022-08-10
  Administered 2022-08-10: 1000 ug via INTRAMUSCULAR

## 2022-08-15 ENCOUNTER — Encounter: Payer: Self-pay | Admitting: Obstetrics and Gynecology

## 2022-08-15 MED ORDER — TIRZEPATIDE 5 MG/0.5ML ~~LOC~~ SOAJ: 5.0000 mg | SUBCUTANEOUS | 6 refills | Status: DC

## 2022-09-06 ENCOUNTER — Other Ambulatory Visit: Payer: Self-pay | Admitting: Family Medicine

## 2022-09-06 DIAGNOSIS — F3281 Premenstrual dysphoric disorder: Secondary | ICD-10-CM

## 2022-09-08 MED ORDER — CLONAZEPAM 0.5 MG PO TABS: 0.5000 mg | ORAL_TABLET | Freq: Two times a day (BID) | ORAL | 0 refills | Status: DC | PRN

## 2022-09-21 ENCOUNTER — Telehealth: Payer: Self-pay

## 2022-09-22 MED ORDER — TIRZEPATIDE 7.5 MG/0.5ML ~~LOC~~ SOAJ: 7.5000 mg | SUBCUTANEOUS | 6 refills | Status: DC

## 2022-09-22 NOTE — Telephone Encounter (Signed)
Dose changed

## 2023-03-01 ENCOUNTER — Other Ambulatory Visit: Payer: Self-pay | Admitting: Physician Assistant

## 2023-03-01 ENCOUNTER — Other Ambulatory Visit: Payer: Self-pay | Admitting: Family Medicine

## 2023-03-01 DIAGNOSIS — F3281 Premenstrual dysphoric disorder: Secondary | ICD-10-CM

## 2023-03-01 DIAGNOSIS — G43009 Migraine without aura, not intractable, without status migrainosus: Secondary | ICD-10-CM

## 2023-03-08 ENCOUNTER — Ambulatory Visit: Payer: Self-pay | Admitting: Family Medicine

## 2023-03-20 ENCOUNTER — Ambulatory Visit: Payer: Self-pay | Admitting: Family Medicine

## 2023-03-20 ENCOUNTER — Encounter: Payer: Self-pay | Admitting: Family Medicine

## 2023-03-20 VITALS — BP 120/73 | HR 78 | Ht 64.0 in | Wt 171.8 lb

## 2023-03-20 DIAGNOSIS — F439 Reaction to severe stress, unspecified: Secondary | ICD-10-CM

## 2023-03-20 DIAGNOSIS — F3281 Premenstrual dysphoric disorder: Secondary | ICD-10-CM

## 2023-03-20 DIAGNOSIS — J3081 Allergic rhinitis due to animal (cat) (dog) hair and dander: Secondary | ICD-10-CM

## 2023-03-20 DIAGNOSIS — R41840 Attention and concentration deficit: Secondary | ICD-10-CM

## 2023-03-20 DIAGNOSIS — G43009 Migraine without aura, not intractable, without status migrainosus: Secondary | ICD-10-CM

## 2023-03-20 MED ORDER — CLONAZEPAM 0.5 MG PO TABS: 0.5000 mg | ORAL_TABLET | Freq: Two times a day (BID) | ORAL | 0 refills | Status: DC | PRN

## 2023-03-20 MED ORDER — SUMATRIPTAN SUCCINATE 50 MG PO TABS: 50.0000 mg | ORAL_TABLET | ORAL | 0 refills | Status: DC | PRN

## 2023-03-20 NOTE — Assessment & Plan Note (Signed)
 Chronic, stable Pt requesting referral for ADHD evaluation

## 2023-03-20 NOTE — Assessment & Plan Note (Signed)
 Chronic, stable Continue use of Zyrtec daily for environmental allergies

## 2023-03-20 NOTE — Assessment & Plan Note (Signed)
 Increased stress d/t spouse unemployed Lack of insurance And General increased responsibilities in home life States getting better Deferred referral to behavioral health today d/t cost.

## 2023-03-20 NOTE — Assessment & Plan Note (Addendum)
 Chronic, pt stated improving as her hormones are changing Intermittent use of as needed klonopin  0.5 BID for increased anxiety during her cycle.  PMDP reviewed. Reordered, no refills.

## 2023-03-20 NOTE — Assessment & Plan Note (Signed)
 Hormonal and stress-related migraines, less frequent and severe than in the past.  No effective abortive therapy currently in use. -Prescribe Sumatriptan  50mg  as needed for migraines. -Continue use of tylenol  or ibuprofen  as needed during migraines

## 2023-03-20 NOTE — Progress Notes (Signed)
 Established Patient Office Visit  Introduced to nurse practitioner role and practice setting.  All questions answered.  Discussed provider/patient relationship and expectations.   Subjective   Patient ID: Haley Moore, female    DOB: 11-Jan-1976  Age: 48 y.o. MRN: [id number]  Chief Complaint  Patient presents with   Medication Management   Care Management    Would like to take care of cologuard and other screenings once she has insurance    Haley Moore is a 48 year old female who presents for a general follow-up for chronic disease management.  She has a history of migraines, which have improved since she believes she is perimenopausal. Previously, migraines occurred every menstrual cycle and lasted three days, but now they are less frequent and manageable. They are described as stress-related, often starting in her neck and usually on one side. She has tried various treatments, including acupuncture and chiropractic care, but finds that 'nothing works'. She previously tried Nurtec but did not continue due to cost. She is not currently on any migraine-specific medication but requests sumatriptan  for management.  She has a history of dysmenorrhea and endometriosis, which have improved significantly since starting hormone therapy with a low dose of testosterone prescribed through a provider in [facility]. She no longer takes Prozac  due to its side effects on her libido and prefers to manage her premenstrual dysphoric disorder (PMDD) symptoms with occasional Klonopin , which she uses sparingly during stressful times. Her husband's job loss has added to her stress, and she is the sole provider for the household.  She is currently on Mounjaro  7.5 mg, which has helped her lose weight from 230 pounds. She attributes some of her previous health issues, including severe headaches, to weight gain during the COVID-19 pandemic when gyms were closed. She also takes a multivitamin, vitamin D ,  omega-3, magnesium glycinate, and hyaluronic acid as supplements.  She has a history of allergic rhinitis, primarily seasonal and dog allergies, for which she takes Zyrtec daily. She has no known drug allergies.  Her blood pressure is slightly elevated, which she attributes to stress from her current life situation, including her husband's unemployment and her demanding work schedule as a Acupuncturist.         03/20/2023    1:05 PM 04/28/2022   10:44 AM 04/19/2022    1:25 PM  Depression screen PHQ 2/9  Decreased Interest 2 2 3   Down, Depressed, Hopeless 2 1 3   PHQ - 2 Score 4 3 6   Altered sleeping 2 3 3   Tired, decreased energy 2 1 3   Change in appetite 1 2 3   Feeling bad or failure about yourself  1 2 3   Trouble concentrating 1 2 3   Moving slowly or fidgety/restless 1 2 3   Suicidal thoughts 0 0 1  PHQ-9 Score 12 15 25   Difficult doing work/chores Somewhat difficult Somewhat difficult Extremely dIfficult       03/20/2023    1:05 PM 04/28/2022   10:43 AM 03/17/2022    1:02 PM 09/06/2021    1:40 PM  GAD 7 : Generalized Anxiety Score  Nervous, Anxious, on Edge 2 1 3 3   Control/stop worrying 2 2 3 3   Worry too much - different things 2 2 3 3   Trouble relaxing 2 1 3 2   Restless 2 0 3 1  Easily annoyed or irritable 3 3 3 3   Afraid - awful might happen 2 2 3 1   Total GAD 7 Score 15  11 21 16   Anxiety Difficulty Somewhat difficult Extremely difficult Extremely difficult Very difficult     Review of Systems  Psychiatric/Behavioral:         Increased life stress  All other systems reviewed and are negative.   Negative unless indicated in HPI   Objective:     BP 120/73 (Cuff Size: Normal)   Pulse 78   Ht 5\' 4"  (1.626 m)   Wt 171 lb 12.8 oz (77.9 kg)   SpO2 99%   BMI 29.49 kg/m    Physical Exam Constitutional:      General: She is not in acute distress.    Appearance: Normal appearance. She is overweight. She is not toxic-appearing or diaphoretic.   HENT:     Head: Normocephalic.     Nose: Nose normal.     Mouth/Throat:     Mouth: Mucous membranes are moist.     Pharynx: Oropharynx is clear.  Eyes:     Extraocular Movements: Extraocular movements intact.     Pupils: Pupils are equal, round, and reactive to light.  Cardiovascular:     Rate and Rhythm: Normal rate and regular rhythm.     Pulses: Normal pulses.     Heart sounds: Normal heart sounds. No murmur heard.    No friction rub. No gallop.  Pulmonary:     Effort: No respiratory distress.     Breath sounds: No stridor. No wheezing, rhonchi or rales.  Chest:     Chest wall: No tenderness.  Musculoskeletal:     Right lower leg: No edema.     Left lower leg: No edema.  Skin:    General: Skin is warm and dry.     Capillary Refill: Capillary refill takes less than 2 seconds.  Neurological:     General: No focal deficit present.     Mental Status: She is alert and oriented to person, place, and time. Mental status is at baseline.  Psychiatric:        Mood and Affect: Mood is anxious. Affect is tearful.        Speech: Speech normal.        Behavior: Behavior normal. Behavior is cooperative.        Thought Content: Thought content normal.        Judgment: Judgment normal.    No results found for any visits on 03/20/23.    The 10-year ASCVD risk score (Arnett DK, et al., 2019) is: 0.8%    Assessment & Plan:  Migraine without aura and without status migrainosus, not intractable Assessment & Plan: Hormonal and stress-related migraines, less frequent and severe than in the past.  No effective abortive therapy currently in use. -Prescribe Sumatriptan  50mg  as needed for migraines. -Continue use of tylenol  or ibuprofen  as needed during migraines  Orders: -     SUMAtriptan  Succinate; Take 1 tablet (50 mg total) by mouth every 2 (two) hours as needed for migraine. May repeat in 2 hours if headache persists or recurs.  Dispense: 10 tablet; Refill: 0  PMDD (premenstrual  dysphoric disorder) Assessment & Plan: Chronic, pt stated improving as her hormones are changing Intermittent use of as needed klonopin  0.5 BID for increased anxiety during her cycle.  PMDP reviewed. Reordered, no refills.  Orders: -     clonazePAM ; Take 1 tablet (0.5 mg total) by mouth 2 (two) times daily as needed (severe anxiety).  Dispense: 30 tablet; Refill: 0  Difficulty concentrating Assessment & Plan: Chronic, stable Pt requesting referral  for ADHD evaluation  Orders: -     Ambulatory referral to Neuropsychology  Allergic rhinitis due to animal hair and dander Assessment & Plan: Chronic, stable Continue use of Zyrtec daily for environmental allergies   Stress Assessment & Plan: Increased stress d/t spouse unemployed Lack of insurance And General increased responsibilities in home life States getting better Deferred referral to behavioral health today d/t cost.    Pt uninsured, would like to hold on labs and cologuard screening until she is back on insurance.  UTD on mammogram and PAP. Plan for labs at next visit  Return in about 6 months (around 09/17/2023).   I, Tasia Farr, FNP, have reviewed all documentation for this visit. The documentation on 03/20/23 for the exam, diagnosis, procedures, and orders are all accurate and complete.   Tasia Farr, FNP

## 2023-04-11 ENCOUNTER — Telehealth: Payer: Self-pay | Admitting: Family Medicine

## 2023-04-11 NOTE — Telephone Encounter (Signed)
 Copied from CRM [id number]. Topic: Clinical - Medication Refill >> Apr 11, 2023 11:33 AM Macon Large wrote: Most Recent Primary Care Visit:  Provider: Alfredia Ferguson  Department: [facility]  Visit Type: OFFICE VISIT  Date: 04/28/2022  Medication: FLUoxetine (PROZAC) 20 MG capsule  Has the patient contacted their pharmacy? No - This medication is not on patient's current med list but it is listed in the medication history list  Is this the correct pharmacy for this prescription? Yes If no, delete pharmacy and type the correct one.  This is the patient's preferred pharmacy:  [facility] [address], [place] - [address] [place] [address] Phone: [phone] Fax: [phone]  [facility] [address], [place] - [address] [place] [address] Phone: [phone] Fax: [phone]  [facility] [place], [place] - [address] [place] [address] Phone: [phone] Fax: [phone]   Has the prescription been filled recently? No  Is the patient out of the medication? Yes  Has the patient been seen for an appointment in the last year OR does the patient have an upcoming appointment? Yes  Can we respond through MyChart? Yes  Agent: Please be advised that Rx refills may take up to 3 business days. We ask that you follow-up with your pharmacy.

## 2023-04-11 NOTE — Telephone Encounter (Signed)
 Patient declined use of this medication during visit on 03/20/23, patient had stopped taking due to decreased libido.

## 2023-04-25 ENCOUNTER — Telehealth: Payer: Self-pay

## 2023-04-25 NOTE — Telephone Encounter (Signed)
 I wouldn't necessarily recommend her going back and forth between doses. Does her compounding pharmacy take coupons? If the do, the [facility] website has cost-saving coupons that may be able to be helpful in managing the cost.

## 2023-04-25 NOTE — Telephone Encounter (Signed)
 I'm sorry it was not the dose, it would be getting it from a different pharmacy. She is getting it from a compound pharmacy now because of a shortage, Tomorrow will be her last day she can get it there for the cheaper price. She can go ahead and buy up month in advance. She wants to stay at the same dose.

## 2023-04-25 NOTE — Telephone Encounter (Signed)
 Pt calling to see if she can get additional refills on the mounjaro while she can get it for 300 dollars. When the dose changes it will  be more expensive. Pharmacy told her she could stock up on it. Pt aware I would send the message to you.

## 2023-04-27 ENCOUNTER — Other Ambulatory Visit: Payer: Self-pay | Admitting: Family Medicine

## 2023-04-27 MED ORDER — TIRZEPATIDE 7.5 MG/0.5ML ~~LOC~~ SOAJ: 7.5000 mg | SUBCUTANEOUS | 6 refills | Status: DC

## 2023-04-27 NOTE — Telephone Encounter (Signed)
 Copied from CRM [id number]. Topic: Clinical - Medication Refill >> Apr 27, 2023 11:00 AM Emylou G wrote: Most Recent Primary Care Visit:  Provider: Charlcie Cradle A  Department: [facility]  Visit Type: OFFICE VISIT  Date: 03/20/2023  Medication: fluoxetine 20mg   Has the patient contacted their pharmacy? No (Agent: If no, request that the patient contact the pharmacy for the refill. If patient does not wish to contact the pharmacy document the reason why and proceed with request.) (Agent: If yes, when and what did the pharmacy advise?)  Is this the correct pharmacy for this prescription? Yes If no, delete pharmacy and type the correct one.  This is the patient's preferred pharmacy:  [facility] [address], [place] - [address] [place] [address] Phone: [phone] Fax: [phone]    Has the prescription been filled recently? No  Is the patient out of the medication? Yes  Has the patient been seen for an appointment in the last year OR does the patient have an upcoming appointment? Yes  Can we respond through MyChart? Yes  Agent: Please be advised that Rx refills may take up to 3 business days. We ask that you follow-up with your pharmacy.

## 2023-04-27 NOTE — Addendum Note (Signed)
 Addended by: Fabian November on: 04/27/2023 06:44 PM   Modules accepted: Orders

## 2023-04-28 ENCOUNTER — Telehealth: Payer: Self-pay

## 2023-04-28 ENCOUNTER — Other Ambulatory Visit: Payer: Self-pay

## 2023-04-28 MED ORDER — TIRZEPATIDE 7.5 MG/0.5ML ~~LOC~~ SOAJ: 7.5000 mg | SUBCUTANEOUS | 6 refills | Status: AC

## 2023-04-28 NOTE — Telephone Encounter (Signed)
 Pt called to have her RX of Mounjaro sent to [facility]. Rx sent there. Called [facility] pharmacy to cancel the order sent there.

## 2023-04-28 NOTE — Telephone Encounter (Signed)
 Requested medication (s) are due for refill today: yes  Requested medication (s) are on the active medication list: yes  Last refill:  04/27/23  Future visit scheduled: yes  Notes to clinic:  Unable to refill per protocol, last refill by another/historical provider.      Requested Prescriptions  Pending Prescriptions Disp Refills   FLUoxetine (PROZAC) 20 MG capsule      Sig: Take 1 capsule (20 mg total) by mouth daily.     Psychiatry:  Antidepressants - SSRI Failed - 04/28/2023 11:48 AM      Failed - Valid encounter within last 6 months    Recent Outpatient Visits           1 year ago PMDD (premenstrual dysphoric disorder)   [place] Alfredia Ferguson, PA-C   1 year ago Annual physical exam   [facility] Alfredia Ferguson, PA-C   1 year ago PMDD (premenstrual dysphoric disorder)   [place] Alfredia Ferguson, PA-C   1 year ago Difficulty concentrating   [facility] Alfredia Ferguson, PA-C   1 year ago PMDD (premenstrual dysphoric disorder)   [facility] Alfredia Ferguson, PA-C       Future Appointments             In 4 months Sallee Provencal, FNP [facility]            Passed - Completed PHQ-2 or PHQ-9 in the last 360 days      Signed Prescriptions Disp Refills   FLUoxetine (PROZAC) 20 MG capsule      Sig: Take 20 mg by mouth daily.     There is no refill protocol information for this order

## 2023-05-22 ENCOUNTER — Telehealth: Payer: Self-pay

## 2023-05-22 DIAGNOSIS — F3281 Premenstrual dysphoric disorder: Secondary | ICD-10-CM

## 2023-05-22 NOTE — Telephone Encounter (Signed)
 Please see the message below, per Kellie encounter on 3/4 "Patient declined use of this medication during visit on 03/20/23, patient had stopped taking due to decreased libido." Please advise the pt is now requesting this medication

## 2023-05-22 NOTE — Telephone Encounter (Signed)
 Copied from CRM [id number]. Topic: Clinical - Medication Question >> May 22, 2023 10:06 AM Leory Rands wrote: Reason for CRM: Patient is calling to report she request a refill on FLUoxetine (PROZAC) 20 MG capsule [[id number]]. Patient reports that she just was in the office on 03/28/23 and discuss this with Kellie. Patient reports that the Klonopin helps but her mood is still up and down. And is wanting a mood stablizer. Patient states she put a request in for the fluoxetine back in March and did not hear anything. Patient is agreeable to an appt. But would like a message put in first.

## 2023-05-23 ENCOUNTER — Other Ambulatory Visit: Payer: Self-pay | Admitting: Family Medicine

## 2023-05-23 NOTE — Telephone Encounter (Signed)
 Copied from CRM [id number]. Topic: Clinical - Medication Refill >> May 23, 2023 12:38 PM Rosaria Common wrote: Most Recent Primary Care Visit:  Provider: CLIFTON, KELLIE A  Department: [facility]  Visit Type: OFFICE VISIT  Date: 03/20/2023  Medication: FLUoxetine (PROZAC) 20 MG capsule  Has the patient contacted their pharmacy? Yes (Agent: If no, request that the patient contact the pharmacy for the refill. If patient does not wish to contact the pharmacy document the reason why and proceed with request.) (Agent: If yes, when and what did the pharmacy advise?)  Is this the correct pharmacy for this prescription? Yes If no, delete pharmacy and type the correct one.  This is the patient's preferred pharmacy:  [facility] [address], [place] - [address] [place] [address] Phone: [phone] Fax: [phone]     Has the prescription been filled recently? No  Is the patient out of the medication? Yes  Has the patient been seen for an appointment in the last year OR does the patient have an upcoming appointment? Yes  Can we respond through MyChart? Yes  Agent: Please be advised that Rx refills may take up to 3 business days. We ask that you follow-up with your pharmacy.

## 2023-05-24 NOTE — Telephone Encounter (Signed)
 Requested medication (s) are due for refill today unsure  Requested medication (s) are on the active medication list -yes  Future visit scheduled -yes  Last refill: unsure  Notes to clinic: listed as historical medication - sent for review of request   Requested Prescriptions  Pending Prescriptions Disp Refills   FLUoxetine (PROZAC) 20 MG capsule      Sig: Take 1 capsule (20 mg total) by mouth daily.     Psychiatry:  Antidepressants - SSRI Passed - 05/24/2023  2:10 PM      Passed - Completed PHQ-2 or PHQ-9 in the last 360 days      Passed - Valid encounter within last 6 months    Recent Outpatient Visits           2 months ago Migraine without aura and without status migrainosus, not intractable   [place] [facility] [place], Ronny Colas, FNP       Future Appointments             In 3 months Tasia Farr, FNP [facility], [facility]               Requested Prescriptions  Pending Prescriptions Disp Refills   FLUoxetine (PROZAC) 20 MG capsule      Sig: Take 1 capsule (20 mg total) by mouth daily.     Psychiatry:  Antidepressants - SSRI Passed - 05/24/2023  2:10 PM      Passed - Completed PHQ-2 or PHQ-9 in the last 360 days      Passed - Valid encounter within last 6 months    Recent Outpatient Visits           2 months ago Migraine without aura and without status migrainosus, not intractable   [place] [facility], Ronny Colas, FNP       Future Appointments             In 3 months Bennett Brass, Ronny Colas, FNP [facility], [facility]

## 2023-05-25 MED ORDER — FLUOXETINE HCL 20 MG PO CAPS: 20.0000 mg | ORAL_CAPSULE | Freq: Every day | ORAL | 1 refills | Status: DC

## 2023-05-25 NOTE — Addendum Note (Signed)
 Addended byJudyann Number on: 05/25/2023 05:00 PM   Modules accepted: Orders

## 2023-06-27 ENCOUNTER — Other Ambulatory Visit: Payer: Self-pay | Admitting: Family Medicine

## 2023-06-27 DIAGNOSIS — G43009 Migraine without aura, not intractable, without status migrainosus: Secondary | ICD-10-CM

## 2023-07-25 ENCOUNTER — Ambulatory Visit: Payer: Self-pay

## 2023-07-25 ENCOUNTER — Telehealth: Payer: Self-pay | Admitting: Physician Assistant

## 2023-07-25 DIAGNOSIS — B9689 Other specified bacterial agents as the cause of diseases classified elsewhere: Secondary | ICD-10-CM

## 2023-07-25 DIAGNOSIS — J019 Acute sinusitis, unspecified: Secondary | ICD-10-CM

## 2023-07-25 MED ORDER — AMOXICILLIN-POT CLAVULANATE 875-125 MG PO TABS: 1.0000 | ORAL_TABLET | Freq: Two times a day (BID) | ORAL | 0 refills | Status: DC

## 2023-07-25 NOTE — Telephone Encounter (Signed)
 FYI Only or Action Required?: FYI only for provider  Patient was last seen in primary care on 03/20/2023 by Tasia Farr, FNP. Called Nurse Triage reporting Cough. Symptoms began a week ago. Interventions attempted: OTC medications: musinex. Symptoms are: Cough, ear crackles, pink fluid on q-tip, Green phlegm, runny nosegradually worsening.  Triage Disposition: See PCP When Office is Open (Within 3 Days)  Patient/caregiver understands and will follow disposition?: Yes            Copied from CRM [id number]. Topic: Clinical - Red Word Triage >> Jul 25, 2023  8:06 AM Essie A wrote: Red Word that prompted transfer to Nurse Triage: Not feeling well since last Tuesday.  Coughing green mucus, no fever, stuffy nose, leaking ears with pinkish discharge. Reason for Disposition  Cough has been present for > 3 weeks  Answer Assessment - Initial Assessment Questions 1. ONSET: When did the cough begin?      1 week - just not feeling good. Cough started last last week 2. SEVERITY: How bad is the cough today?      moderate 3. SPUTUM: Describe the color of your sputum (none, dry cough; clear, white, yellow, green)     green 4. HEMOPTYSIS: Are you coughing up any blood? If so ask: How much? (flecks, streaks, tablespoons, etc.)     no 5. DIFFICULTY BREATHING: Are you having difficulty breathing? If Yes, ask: How bad is it? (e.g., mild, moderate, severe)    - MILD: No SOB at rest, mild SOB with walking, speaks normally in sentences, can lie down, no retractions, pulse < 100.    - MODERATE: SOB at rest, SOB with minimal exertion and prefers to sit, cannot lie down flat, speaks in phrases, mild retractions, audible wheezing, pulse 100-120.    - SEVERE: Very SOB at rest, speaks in single words, struggling to breathe, sitting hunched forward, retractions, pulse > 120      mild 6. FEVER: Do you have a fever? If Yes, ask: What is your temperature, how was it measured, and when did it  start?     no 7. CARDIAC HISTORY: Do you have any history of heart disease? (e.g., heart attack, congestive heart failure)      no 8. LUNG HISTORY: Do you have any history of lung disease?  (e.g., pulmonary embolus, asthma, emphysema)     no 9. PE RISK FACTORS: Do you have a history of blood clots? (or: recent major surgery, recent prolonged travel, bedridden)     no 10. OTHER SYMPTOMS: Do you have any other symptoms? (e.g., runny nose, wheezing, chest pain)       Runny nose - pink on q-tip, ear pain, crackle in ears 11. PREGNANCY: Is there any chance you are pregnant? When was your last menstrual period?       no 12. TRAVEL: Have you traveled out of the country in the last month? (e.g., travel history, exposures)       no  Protocols used: Cough - Acute Productive-A-AH

## 2023-07-25 NOTE — Patient Instructions (Signed)
 Haley Moore, thank you for joining [name], PA-C for today's virtual visit.  While this provider is not your primary care provider (PCP), if your PCP is located in our provider database this encounter information will be shared with them immediately following your visit.   A [facility] MyChart account gives you access to today's visit and all your visits, tests, and labs performed at [facility]  click here if you don't have a [facility] MyChart account or go to [email or web address]  Consent: (Patient) Haley Moore provided verbal consent for this virtual visit at the beginning of the encounter.  Current Medications:  Current Outpatient Medications:    amoxicillin -clavulanate (AUGMENTIN) 875-125 MG tablet, Take 1 tablet by mouth 2 (two) times daily., Disp: 20 tablet, Rfl: 0   Cholecalciferol 25 MCG (1000 UT) tablet, Take 1 tablet by mouth daily., Disp: , Rfl:    clonazePAM  (KLONOPIN ) 0.5 MG tablet, Take 1 tablet (0.5 mg total) by mouth 2 (two) times daily as needed (severe anxiety)., Disp: 30 tablet, Rfl: 0   COLLAGEN PO, Take 1 tablet by mouth daily., Disp: , Rfl:    FLUoxetine  (PROZAC ) 20 MG capsule, Take 1 capsule (20 mg total) by mouth daily., Disp: 30 capsule, Rfl: 1   Multiple Vitamin (MULTIVITAMIN) tablet, Take 1 tablet by mouth daily., Disp: , Rfl:    SUMAtriptan  (IMITREX ) 50 MG tablet, TAKE 1 TABLET BY MOUTH ONCE DAILY AS NEEDED FOR  MIGRAINE  -  MAY  REPEAT  IN  2  HOURS  IF  HEADACHE  PERSISTS  OR  RECURS, Disp: 10 tablet, Rfl: 0   tirzepatide  (MOUNJARO ) 7.5 MG/0.5ML Pen, Inject 7.5 mg into the skin once a week. TIRZEPATIDE  Compounded, Disp: 2 mL, Rfl: 6   Medications ordered in this encounter:  Meds ordered this encounter  Medications   amoxicillin -clavulanate (AUGMENTIN) 875-125 MG tablet    Sig: Take 1 tablet by mouth 2 (two) times daily.    Dispense:  20 tablet    Refill:  0    Supervising Provider:   LAMPTEY, PHILIP O [[id number]]      *If you need refills on other medications prior to your next appointment, please contact your pharmacy*  Follow-Up: Call back or seek an in-person evaluation if the symptoms worsen or if the condition fails to improve as anticipated.  [facility] [phone]  Other Instructions Sinus Infection, Adult A sinus infection, also called sinusitis, is inflammation of your sinuses. Sinuses are hollow spaces in the bones around your face. Your sinuses are located: Around your eyes. In the middle of your forehead. Behind your nose. In your cheekbones. Mucus normally drains out of your sinuses. When your nasal tissues become inflamed or swollen, mucus can become trapped or blocked. This allows bacteria, viruses, and fungi to grow, which leads to infection. Most infections of the sinuses are caused by a virus. A sinus infection can develop quickly. It can last for up to 4 weeks (acute) or for more than 12 weeks (chronic). A sinus infection often develops after a cold. What are the causes? This condition is caused by anything that creates swelling in the sinuses or stops mucus from draining. This includes: Allergies. Asthma. Infection from bacteria or viruses. Deformities or blockages in your nose or sinuses. Abnormal growths in the nose (nasal polyps). Pollutants, such as chemicals or irritants in the air. Infection from fungi. This is rare. What increases the risk? You are more likely to develop this condition  if you: Have a weak body defense system (immune system). Do a lot of swimming or diving. Overuse nasal sprays. Smoke. What are the signs or symptoms? The main symptoms of this condition are pain and a feeling of pressure around the affected sinuses. Other symptoms include: Stuffy nose or congestion that makes it difficult to breathe through your nose. Thick yellow or greenish drainage from your nose. Tenderness, swelling, and warmth over the affected sinuses. A  cough that may get worse at night. Decreased sense of smell and taste. Extra mucus that collects in the throat or the back of the nose (postnasal drip) causing a sore throat or bad breath. Tiredness (fatigue). Fever. How is this diagnosed? This condition is diagnosed based on: Your symptoms. Your medical history. A physical exam. Tests to find out if your condition is acute or chronic. This may include: Checking your nose for nasal polyps. Viewing your sinuses using a device that has a light (endoscope). Testing for allergies or bacteria. Imaging tests, such as an MRI or CT scan. In rare cases, a bone biopsy may be done to rule out more serious types of fungal sinus disease. How is this treated? Treatment for a sinus infection depends on the cause and whether your condition is chronic or acute. If caused by a virus, your symptoms should go away on their own within 10 days. You may be given medicines to relieve symptoms. They include: Medicines that shrink swollen nasal passages (decongestants). A spray that eases inflammation of the nostrils (topical intranasal corticosteroids). Rinses that help get rid of thick mucus in your nose (nasal saline washes). Medicines that treat allergies (antihistamines). Over-the-counter pain relievers. If caused by bacteria, your health care provider may recommend waiting to see if your symptoms improve. Most bacterial infections will get better without antibiotic medicine. You may be given antibiotics if you have: A severe infection. A weak immune system. If caused by narrow nasal passages or nasal polyps, surgery may be needed. Follow these instructions at home: Medicines Take, use, or apply over-the-counter and prescription medicines only as told by your health care provider. These may include nasal sprays. If you were prescribed an antibiotic medicine, take it as told by your health care provider. Do not stop taking the antibiotic even if you start  to feel better. Hydrate and humidify  Drink enough fluid to keep your urine pale yellow. Staying hydrated will help to thin your mucus. Use a cool mist humidifier to keep the humidity level in your home above 50%. Inhale steam for 10-15 minutes, 3-4 times a day, or as told by your health care provider. You can do this in the bathroom while a hot shower is running. Limit your exposure to cool or dry air. Rest Rest as much as possible. Sleep with your head raised (elevated). Make sure you get enough sleep each night. General instructions  Apply a warm, moist washcloth to your face 3-4 times a day or as told by your health care provider. This will help with discomfort. Use nasal saline washes as often as told by your health care provider. Wash your hands often with soap and water to reduce your exposure to germs. If soap and water are not available, use hand sanitizer. Do not smoke. Avoid being around people who are smoking (secondhand smoke). Keep all follow-up visits. This is important. Contact a health care provider if: You have a fever. Your symptoms get worse. Your symptoms do not improve within 10 days. Get help right away  if: You have a severe headache. You have persistent vomiting. You have severe pain or swelling around your face or eyes. You have vision problems. You develop confusion. Your neck is stiff. You have trouble breathing. These symptoms may be an emergency. Get help right away. Call 911. Do not wait to see if the symptoms will go away. Do not drive yourself to the hospital. Summary A sinus infection is soreness and inflammation of your sinuses. Sinuses are hollow spaces in the bones around your face. This condition is caused by nasal tissues that become inflamed or swollen. The swelling traps or blocks the flow of mucus. This allows bacteria, viruses, and fungi to grow, which leads to infection. If you were prescribed an antibiotic medicine, take it as told by  your health care provider. Do not stop taking the antibiotic even if you start to feel better. Keep all follow-up visits. This is important. This information is not intended to replace advice given to you by your health care provider. Make sure you discuss any questions you have with your health care provider. Document Revised: 12/29/2020 Document Reviewed: 12/29/2020 Elsevier Patient Education  2024 Elsevier Inc.   If you have been instructed to have an in-person evaluation today at a local Urgent Care facility, please use the link below. It will take you to a list of all of our available [facility], including address, phone number and hours of operation. Please do not delay care.  [facility]  If you or a family member do not have a primary care provider, use the link below to schedule a visit and establish care. When you choose a [facility] primary care physician or advanced practice provider, you gain a long-term partner in health. Find a Primary Care Provider  Learn more about [facility]'s in-office and virtual care options: [facility] - Get Care Now

## 2023-07-25 NOTE — Progress Notes (Signed)
 Virtual Visit Consent   Haley Moore, you are scheduled for a virtual visit with a [facility] provider today. Just as with appointments in the office, your consent must be obtained to participate. Your consent will be active for this visit and any virtual visit you may have with one of our providers in the next 365 days. If you have a MyChart account, a copy of this consent can be sent to you electronically.  As this is a virtual visit, video technology does not allow for your provider to perform a traditional examination. This may limit your provider's ability to fully assess your condition. If your provider identifies any concerns that need to be evaluated in person or the need to arrange testing (such as labs, EKG, etc.), we will make arrangements to do so. Although advances in technology are sophisticated, we cannot ensure that it will always work on either your end or our end. If the connection with a video visit is poor, the visit may have to be switched to a telephone visit. With either a video or telephone visit, we are not always able to ensure that we have a secure connection.  By engaging in this virtual visit, you consent to the provision of healthcare and authorize for your insurance to be billed (if applicable) for the services provided during this visit. Depending on your insurance coverage, you may receive a charge related to this service.  I need to obtain your verbal consent now. Are you willing to proceed with your visit today? Haley Moore has provided verbal consent on 07/25/2023 for a virtual visit (video or telephone). Haley Kelp, PA-C  Date: 07/25/2023 9:37 AM   Virtual Visit via Video Note   I, Haley Moore, connected with  Haley Moore  ([id number], 11/25/1975) on 07/25/23 at  9:30 AM EDT by a video-enabled telemedicine application and verified that I am speaking with the correct person using two identifiers.  Location: Patient: Virtual Visit  Location Patient: Home Provider: Virtual Visit Location Provider: Home Office   I discussed the limitations of evaluation and management by telemedicine and the availability of in person appointments. The patient expressed understanding and agreed to proceed.    History of Present Illness: Haley Moore is a 48 y.o. who identifies as a female who was assigned female at birth, and is being seen today for sinus congestion and ear pain.  HPI: URI  This is a new problem. The current episode started 1 to 4 weeks ago (Just over a week). The problem has been gradually worsening. There has been no fever. Associated symptoms include congestion, coughing, ear pain (right; drained overnight with some pink liquid), headaches, a plugged ear sensation, rhinorrhea (and post nasal drainage; had been running last week, then over the last 2-3 days became congested and not moving), sinus pain and a sore throat. Pertinent negatives include no chest pain, diarrhea, nausea, vomiting or wheezing. Treatments tried: mucinex, tylenol , advil  cold and sinus, nyquil, zinc, multivitamin. The treatment provided no relief.     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   History of chicken pox 12/08/2014   History of colonic polyps 12/08/2014   Genital warts 12/08/2014   Premenstrual tension syndrome 12/08/2014   Dysmenorrhea 07/16/2014   Polymenorrhea 07/16/2014   Allergic rhinitis 01/20/2011   Migraine  headache without aura     Allergies: No Known Allergies Medications:  Current Outpatient Medications:    amoxicillin -clavulanate (AUGMENTIN) 875-125 MG tablet, Take 1 tablet by mouth 2 (two) times daily., Disp: 20 tablet, Rfl: 0   Cholecalciferol 25 MCG (1000 UT) tablet, Take 1 tablet by mouth daily., Disp: , Rfl:    clonazePAM   (KLONOPIN ) 0.5 MG tablet, Take 1 tablet (0.5 mg total) by mouth 2 (two) times daily as needed (severe anxiety)., Disp: 30 tablet, Rfl: 0   COLLAGEN PO, Take 1 tablet by mouth daily., Disp: , Rfl:    FLUoxetine  (PROZAC ) 20 MG capsule, Take 1 capsule (20 mg total) by mouth daily., Disp: 30 capsule, Rfl: 1   Multiple Vitamin (MULTIVITAMIN) tablet, Take 1 tablet by mouth daily., Disp: , Rfl:    SUMAtriptan  (IMITREX ) 50 MG tablet, TAKE 1 TABLET BY MOUTH ONCE DAILY AS NEEDED FOR  MIGRAINE  -  MAY  REPEAT  IN  2  HOURS  IF  HEADACHE  PERSISTS  OR  RECURS, Disp: 10 tablet, Rfl: 0   tirzepatide  (MOUNJARO ) 7.5 MG/0.5ML Pen, Inject 7.5 mg into the skin once a week. TIRZEPATIDE  Compounded, Disp: 2 mL, Rfl: 6  Observations/Objective: Patient is well-developed, well-nourished in no acute distress.  Resting comfortably at home.  Head is normocephalic, atraumatic.  No labored breathing.  Speech is clear and coherent with logical content.  Patient is alert and oriented at baseline.    Assessment and Plan: 1. Acute bacterial sinusitis (Primary) - amoxicillin -clavulanate (AUGMENTIN) 875-125 MG tablet; Take 1 tablet by mouth 2 (two) times daily.  Dispense: 20 tablet; Refill: 0  - Worsening symptoms that have not responded to OTC medications.  - Will give Augmentin - Continue allergy medications.  - Steam and humidifier can help - Stay well hydrated and get plenty of rest.  - Seek in person evaluation if no symptom improvement or if symptoms worsen   Follow Up Instructions: I discussed the assessment and treatment plan with the patient. The patient was provided an opportunity to ask questions and all were answered. The patient agreed with the plan and demonstrated an understanding of the instructions.  A copy of instructions were sent to the patient via MyChart unless otherwise noted below.    The patient was advised to call back or seek an in-person evaluation if the symptoms worsen or if the condition  fails to improve as anticipated.    Haley Kelp, PA-C

## 2023-08-18 ENCOUNTER — Telehealth: Payer: Self-pay | Admitting: Emergency Medicine

## 2023-08-18 ENCOUNTER — Ambulatory Visit: Payer: Self-pay

## 2023-08-18 DIAGNOSIS — M545 Low back pain, unspecified: Secondary | ICD-10-CM

## 2023-08-18 MED ORDER — CYCLOBENZAPRINE HCL 10 MG PO TABS: 10.0000 mg | ORAL_TABLET | Freq: Three times a day (TID) | ORAL | 0 refills | Status: DC | PRN

## 2023-08-18 NOTE — Progress Notes (Signed)
 Virtual Visit Consent   Haley Moore, you are scheduled for a virtual visit with a [facility] provider today. Just as with appointments in the office, your consent must be obtained to participate. Your consent will be active for this visit and any virtual visit you may have with one of our providers in the next 365 days. If you have a MyChart account, a copy of this consent can be sent to you electronically.  As this is a virtual visit, video technology does not allow for your provider to perform a traditional examination. This may limit your provider's ability to fully assess your condition. If your provider identifies any concerns that need to be evaluated in person or the need to arrange testing (such as labs, EKG, etc.), we will make arrangements to do so. Although advances in technology are sophisticated, we cannot ensure that it will always work on either your end or our end. If the connection with a video visit is poor, the visit may have to be switched to a telephone visit. With either a video or telephone visit, we are not always able to ensure that we have a secure connection.  By engaging in this virtual visit, you consent to the provision of healthcare and authorize for your insurance to be billed (if applicable) for the services provided during this visit. Depending on your insurance coverage, you may receive a charge related to this service.  I need to obtain your verbal consent now. Are you willing to proceed with your visit today? Haley Moore has provided verbal consent on 08/18/2023 for a virtual visit (video or telephone). Jon CHRISTELLA Belt, NP  Date: 08/18/2023 2:00 PM   Virtual Visit via Video Note   I, Jon CHRISTELLA Belt, connected with  [name]  ([id number], January 21, 1976) on 08/18/23 at  1:45 PM EDT by a video-enabled telemedicine application and verified that I am speaking with the correct person using two identifiers.  Location: Patient: Virtual Visit Location  Patient: Home Provider: Virtual Visit Location Provider: Home Office   I discussed the limitations of evaluation and management by telemedicine and the availability of in person appointments. The patient expressed understanding and agreed to proceed.    History of Present Illness: Haley Moore is a 48 y.o. who identifies as a female who was assigned female at birth, and is being seen today for low back pain for 7+ days. Back has seized up. She tweaked it at the gym last week on 08/10/23. Feels muscles getting tighter and tighter. Describes as a burning pain located in low back, worse on R side. Denies numbness, tingling, weakness, change in bowel or bladder habits.   Has been using ibuprofen , tylenol , Epsom salt soaks, Icy hot patches, heat without resolution of symptoms.   Thinks ibuprofen  or pain is making her mildly nauseated. No vomiting. Is taking ibuprofen  600mg  po BID or TID.     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   History of chicken pox 12/08/2014   History of colonic polyps 12/08/2014   Genital warts 12/08/2014   Premenstrual tension syndrome 12/08/2014   Dysmenorrhea 07/16/2014   Polymenorrhea 07/16/2014   Allergic rhinitis 01/20/2011   Migraine headache without aura     Allergies: No Known Allergies Medications:  Current Outpatient Medications:    cyclobenzaprine  (  FLEXERIL ) 10 MG tablet, Take 1 tablet (10 mg total) by mouth 3 (three) times daily as needed for muscle spasms., Disp: 30 tablet, Rfl: 0   amoxicillin -clavulanate (AUGMENTIN ) 875-125 MG tablet, Take 1 tablet by mouth 2 (two) times daily., Disp: 20 tablet, Rfl: 0   Cholecalciferol 25 MCG (1000 UT) tablet, Take 1 tablet by mouth daily., Disp: , Rfl:    clonazePAM  (KLONOPIN ) 0.5 MG tablet, Take 1 tablet (0.5  mg total) by mouth 2 (two) times daily as needed (severe anxiety)., Disp: 30 tablet, Rfl: 0   COLLAGEN PO, Take 1 tablet by mouth daily., Disp: , Rfl:    FLUoxetine  (PROZAC ) 20 MG capsule, Take 1 capsule (20 mg total) by mouth daily., Disp: 30 capsule, Rfl: 1   Multiple Vitamin (MULTIVITAMIN) tablet, Take 1 tablet by mouth daily., Disp: , Rfl:    SUMAtriptan  (IMITREX ) 50 MG tablet, TAKE 1 TABLET BY MOUTH ONCE DAILY AS NEEDED FOR  MIGRAINE  -  MAY  REPEAT  IN  2  HOURS  IF  HEADACHE  PERSISTS  OR  RECURS, Disp: 10 tablet, Rfl: 0   tirzepatide  (MOUNJARO ) 7.5 MG/0.5ML Pen, Inject 7.5 mg into the skin once a week. TIRZEPATIDE  Compounded, Disp: 2 mL, Rfl: 6  Observations/Objective: Patient is well-developed, well-nourished in no acute distress.  Resting comfortably  at home.  Head is normocephalic, atraumatic.  No labored breathing.  Speech is clear and coherent with logical content.  Patient is alert and oriented at baseline.    Assessment and Plan: 1. Acute bilateral low back pain without sciatica (Primary)  Discussed supportive care, need for f/u with sports medicine or pcp if pain continues.   Follow Up Instructions: I discussed the assessment and treatment plan with the patient. The patient was provided an opportunity to ask questions and all were answered. The patient agreed with the plan and demonstrated an understanding of the instructions.  A copy of instructions were sent to the patient via MyChart unless otherwise noted below.   The patient was advised to call back or seek an in-person evaluation if the symptoms worsen or if the condition fails to improve as anticipated.    Jon CHRISTELLA Belt, NP

## 2023-08-18 NOTE — Telephone Encounter (Signed)
 FYI Only or Action Required?: FYI only for provider.  Patient was last seen in primary care on 03/20/2023 by Wellington Curtis LABOR, FNP.  Called Nurse Triage reporting Back Pain.  Symptoms began a week ago.  Interventions attempted: OTC medications: ibuprofen , tylenol , Epsom salt soaks, Icy hot patches, heat.  Symptoms are: lower back pain radiates to left hip and knee, nausea gradually worsening.  Triage Disposition: See PCP When Office is Open (Within 3 Days)  Patient/caregiver understands and will follow disposition?: Yes                 Copied from CRM [id number]. Topic: Clinical - Red Word Triage >> Aug 18, 2023 11:04 AM Montie POUR wrote: Red Word that prompted transfer to Nurse Triage:  For about 1 week, her lower back is bad pain. Pain level at 7. Pain is getting worse. She has to work standing up. Reason for Disposition  [1] MODERATE back pain (e.g., interferes with normal activities) AND [2] present > 3 days  Answer Assessment - Initial Assessment Questions 1. ONSET: When did the pain begin? (e.g., minutes, hours, days)     X 1 week.  2. LOCATION: Where does it hurt? (upper, mid or lower back)     Lower. Worse on the right side.  3. SEVERITY: How bad is the pain?  (e.g., Scale 1-10; mild, moderate, or severe)     5/10 while sitting. She states it can get up to a 7-8.  4. PATTERN: Is the pain constant? (e.g., yes, no; constant, intermittent)      Constant.  5. RADIATION: Does the pain shoot into your legs or somewhere else?     She states it feels like it goes into her hips/knees on left side.  6. CAUSE:  What do you think is causing the back pain?      She states working out last week she thinks she overdid something, it was not immediate pain. Patient states she was getting out of the bath earlier this week and twisted wrong, she states that worsened the pain.  7. BACK OVERUSE:  Any recent lifting of heavy objects, strenuous work or exercise?      Exercise last week and twisted the wrong way earlier this week.  8. MEDICINES: What have you taken so far for the pain? (e.g., nothing, acetaminophen , NSAIDS)     Ibuprofen  x 1 week,Tylenol  x today, Icy hot patches, heat, soaking in Epsom salt.  9. NEUROLOGIC SYMPTOMS: Do you have any weakness, numbness, or problems with bowel/bladder control?     No. 10. OTHER SYMPTOMS: Do you have any other symptoms? (e.g., fever, abdomen pain, burning with urination, blood in urine)       Nausea. Patient denies any abdominal or urine symptoms.  11. PREGNANCY: Is there any chance you are pregnant? When was your last menstrual period?       LMP: about a week ago;  she states she is peri menopausal.  Protocols used: Back Pain-A-AH

## 2023-08-18 NOTE — Patient Instructions (Signed)
 Haley Moore, thank you for joining Haley CHRISTELLA Belt, NP for today's virtual visit.  While this provider is not your primary care provider (PCP), if your PCP is located in our provider database this encounter information will be shared with them immediately following your visit.   A [facility] MyChart account gives you access to today's visit and all your visits, tests, and labs performed at [facility]  click here if you don't have a [facility] MyChart account or go to [email or web address]  Consent: (Patient) Haley Moore provided verbal consent for this virtual visit at the beginning of the encounter.  Current Medications:  Current Outpatient Medications:    cyclobenzaprine  (FLEXERIL ) 10 MG tablet, Take 1 tablet (10 mg total) by mouth 3 (three) times daily as needed for muscle spasms., Disp: 30 tablet, Rfl: 0   amoxicillin -clavulanate (AUGMENTIN ) 875-125 MG tablet, Take 1 tablet by mouth 2 (two) times daily., Disp: 20 tablet, Rfl: 0   Cholecalciferol 25 MCG (1000 UT) tablet, Take 1 tablet by mouth daily., Disp: , Rfl:    clonazePAM  (KLONOPIN ) 0.5 MG tablet, Take 1 tablet (0.5 mg total) by mouth 2 (two) times daily as needed (severe anxiety)., Disp: 30 tablet, Rfl: 0   COLLAGEN PO, Take 1 tablet by mouth daily., Disp: , Rfl:    FLUoxetine  (PROZAC ) 20 MG capsule, Take 1 capsule (20 mg total) by mouth daily., Disp: 30 capsule, Rfl: 1   Multiple Vitamin (MULTIVITAMIN) tablet, Take 1 tablet by mouth daily., Disp: , Rfl:    SUMAtriptan  (IMITREX ) 50 MG tablet, TAKE 1 TABLET BY MOUTH ONCE DAILY AS NEEDED FOR  MIGRAINE  -  MAY  REPEAT  IN  2  HOURS  IF  HEADACHE  PERSISTS  OR  RECURS, Disp: 10 tablet, Rfl: 0   tirzepatide  (MOUNJARO ) 7.5 MG/0.5ML Pen, Inject 7.5 mg into the skin once a week. TIRZEPATIDE  Compounded, Disp: 2 mL, Rfl: 6   Medications ordered in this encounter:  Meds ordered this encounter  Medications   cyclobenzaprine  (FLEXERIL ) 10 MG tablet    Sig: Take 1  tablet (10 mg total) by mouth 3 (three) times daily as needed for muscle spasms.    Dispense:  30 tablet    Refill:  0     *If you need refills on other medications prior to your next appointment, please contact your pharmacy*  Follow-Up: Call back or seek an in-person evaluation if the symptoms worsen or if the condition fails to improve as anticipated.  [facility] [phone]  Other Instructions  Be aware the muscle relaxer - cyclobenzaprine  - may make you sleepy. You can take it up to 3 times a day but take it only at night if you need to work or drive.   Follow up with your primary care provider or sports medicine if your pain is not getting better. You may need something like physical therapy.    If you have been instructed to have an in-person evaluation today at a local Urgent Care facility, please use the link below. It will take you to a list of all of our available [facility], including address, phone number and hours of operation. Please do not delay care.  [facility]  If you or a family member do not have a primary care provider, use the link below to schedule a visit and establish care. When you choose a [facility] primary care physician or advanced practice provider, you gain a  long-term partner in health. Find a Primary Care Provider  Learn more about [facility]'s in-office and virtual care options: [facility] - Get Care Now

## 2023-09-18 ENCOUNTER — Ambulatory Visit: Payer: Self-pay | Admitting: Family Medicine

## 2023-09-18 VITALS — BP 128/88 | HR 77 | Ht 64.0 in | Wt 162.1 lb

## 2023-09-18 DIAGNOSIS — M542 Cervicalgia: Secondary | ICD-10-CM

## 2023-09-18 DIAGNOSIS — B351 Tinea unguium: Secondary | ICD-10-CM

## 2023-09-18 DIAGNOSIS — R41840 Attention and concentration deficit: Secondary | ICD-10-CM

## 2023-09-18 DIAGNOSIS — N951 Menopausal and female climacteric states: Secondary | ICD-10-CM

## 2023-09-18 DIAGNOSIS — L700 Acne vulgaris: Secondary | ICD-10-CM

## 2023-09-18 DIAGNOSIS — M549 Dorsalgia, unspecified: Secondary | ICD-10-CM

## 2023-09-18 DIAGNOSIS — R52 Pain, unspecified: Secondary | ICD-10-CM

## 2023-09-18 DIAGNOSIS — G8929 Other chronic pain: Secondary | ICD-10-CM

## 2023-09-18 MED ORDER — TRETINOIN 0.025 % EX CREA: TOPICAL_CREAM | Freq: Every day | CUTANEOUS | 0 refills | Status: AC

## 2023-09-18 MED ORDER — DULOXETINE HCL 20 MG PO CPEP: 20.0000 mg | ORAL_CAPSULE | Freq: Every day | ORAL | 0 refills | Status: DC

## 2023-09-18 NOTE — Progress Notes (Signed)
 Established Patient Office Visit  Introduced to nurse practitioner role and practice setting.  All questions answered.  Discussed provider/patient relationship and expectations.   Subjective   Patient ID: Haley Moore, female    DOB: December 31, 1975  Age: 48 y.o. MRN: [id number]  Chief Complaint  Patient presents with   Medical Management of Chronic Issues    Last seen 03/20/23. Patient reports she would like to see about tretinoin  and discuss toe nail fungus. She also reports she is working out but having pain all the time and taking tumeric as well as tylenol  but would like to know what to do because tylenol  isn't helping much.   Nail Problem    Patient reports concerns for 2 years    Discussed the use of AI scribe software for clinical note transcription with the patient, who gave verbal consent to proceed.  History of Present Illness Haley Moore is a 48 year old female who presents with perimenopausal symptoms and concerns about ADHD.  She experiences perimenopausal symptoms, including irregular menstrual cycles and PMS-related cramps. Her periods have become sporadic, occurring every few months instead of every three weeks as before. She has a history of adverse reactions to hormonal treatments, including severe mood changes and physical symptoms when using birth control and an IUD in the past.  She has concerns about ADHD, describing symptoms of brain fog and attention issues, which she feels have worsened over time. She has not been formally diagnosed but has considered seeking a diagnosis in the past. She has tried natural supplements like Lion's Mane to manage these symptoms.  She has been taking fluoxetine  20 mg, primarily during PMS to manage anxiety, but feels it makes her 'zombified' and affects her sex drive. She is interested in exploring other medication options that might better suit her needs.  She reports chronic pain in her knees, ankles, hips, and joints, and  notes that she has been working out. She has been using ibuprofen  for pain relief but experienced gastrointestinal side effects, including nausea and black stools. She has also tried turmeric for inflammation. 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 She has tried massage and acupuncture for relief, with temporary benefits.  She has a persistent toenail fungus on her big toe, which has been present for at least two years. Over-the-counter treatments have been ineffective.  She uses Zyrtec for allergies and occasionally uses Afrin for nasal congestion. Her husband was recently in between jobs, affecting her insurance coverage.      09/18/2023    2:15 PM 03/20/2023    1:05 PM 04/28/2022   10:44 AM  Depression screen PHQ 2/9  Decreased Interest 1 2 2   Down, Depressed, Hopeless 1 2 1   PHQ - 2 Score 2 4 3   Altered sleeping 3 2 3   Tired, decreased energy 1 2 1   Change in appetite 2 1 2   Feeling bad or failure about yourself  1 1 2   Trouble concentrating 3 1 2   Moving slowly or fidgety/restless 2 1 2   Suicidal thoughts 0 0 0  PHQ-9 Score 14 12 15   Difficult doing work/chores Somewhat difficult Somewhat difficult Somewhat difficult       09/18/2023    2:15 PM 03/20/2023    1:05 PM 04/28/2022   10:43 AM 03/17/2022    1:02 PM  GAD 7 : Generalized Anxiety Score  Nervous, Anxious, on Edge 1 2 1 3   Control/stop worrying 1  2 2 3   Worry too much - different things 1 2 2 3   Trouble relaxing 1 2 1 3   Restless 1 2 0 3  Easily annoyed or irritable 2 3 3 3   Afraid - awful might happen 2 2 2 3   Total GAD 7 Score 9 15 11 21   Anxiety Difficulty Somewhat difficult Somewhat difficult Extremely difficult Extremely difficult     ROS  Negative unless indicated in HPI   Objective:     BP 128/88 (BP Location: Right Arm, Patient Position: Sitting, Cuff Size: Normal)   Pulse 77   Ht 5' 4 (1.626 m)   Wt 162 lb 1.6 oz (73.5 kg)   SpO2  99%   BMI 27.82 kg/m    Physical Exam Vitals reviewed.  Constitutional:      General: She is not in acute distress.    Appearance: Normal appearance. She is well-developed, well-groomed and overweight. She is not toxic-appearing or diaphoretic.  HENT:     Head: Normocephalic.     Nose: Nose normal.     Mouth/Throat:     Mouth: Mucous membranes are moist.     Pharynx: Oropharynx is clear.  Eyes:     Extraocular Movements: Extraocular movements intact.     Pupils: Pupils are equal, round, and reactive to light.  Cardiovascular:     Rate and Rhythm: Normal rate and regular rhythm.     Pulses: Normal pulses.     Heart sounds: Normal heart sounds. No murmur heard.    No friction rub. No gallop.  Pulmonary:     Effort: No respiratory distress.     Breath sounds: No stridor. No wheezing, rhonchi or rales.  Chest:     Chest wall: No tenderness.  Musculoskeletal:     Right lower leg: No edema.     Left lower leg: No edema.  Skin:    General: Skin is warm and dry.     Capillary Refill: Capillary refill takes less than 2 seconds.  Neurological:     General: No focal deficit present.     Mental Status: She is alert and oriented to person, place, and time. Mental status is at baseline.  Psychiatric:        Attention and Perception: She is inattentive.        Mood and Affect: Mood is anxious.        Speech: Speech is tangential.        Behavior: Behavior is hyperactive. Behavior is cooperative.        Thought Content: Thought content does not include homicidal or suicidal plan.        Judgment: Judgment normal.      No results found for any visits on 09/18/23.    The 10-year ASCVD risk score (Arnett DK, et al., 2019) is: 0.9%    Assessment & Plan:  Generalized pain -     DULoxetine  HCl; Take 1 capsule (20 mg total) by mouth daily.  Dispense: 90 capsule; Refill: 0 -     TSH -     Comprehensive metabolic panel with GFR -     Sedimentation rate -     CBC -     C-reactive  protein  Perimenopausal symptoms -     DULoxetine  HCl; Take 1 capsule (20 mg total) by mouth daily.  Dispense: 90 capsule; Refill: 0 -     TSH -     Comprehensive metabolic panel with GFR  Acne vulgaris -  Tretinoin ; Apply topically at bedtime.  Dispense: 45 g; Refill: 0  Chronic neck and back pain -     Ambulatory referral to Physical Therapy  Inattention -     Ambulatory referral to Psychiatry  Fungal infection of toenail -     Ambulatory referral to Podiatry     Assessment and Plan Assessment & Plan Chronic joint and musculoskeletal pain Chronic pain in knees, ankles, hips, and joints, worsened by activity. Ibuprofen  caused gastrointestinal issues. Possible hormonal influence. No prior labs for inflammation. Interested in non-stimulant pain management. 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 - Prescribe Cymbalta  20mg  daily for pain management and mood stabilization. - Recommend Aleve (naproxen) once daily for pain. - Order physical therapy referral for ergonomic assessment and strengthening exercises. - Order labs to check kidney function, electrolyte function, blood count, inflammation markers, and thyroid function.  Perimenopausal symptoms Sporadic menstrual cycles and symptoms align with perimenopause.  Adverse reactions to hormonal treatments. No interest in hormone therapy. - Will trial Cymbalta  20mg  for mood and perimenopausal symptoms  Depression, anxiety, and possible attention deficit symptoms Intermittent anxiety and mood changes, especially around PMS. Fluoxetine  decreased libido and caused sedation. Possible ADHD symptoms. Interested in non-stimulant ADHD and mood medication. Discussed Cymbalta  benefits. - Discontinue fluoxetine . - Prescribe Cymbalta  20mg  for mood stabilization and pain management. - [facility] to [facility] for ADHD evaluation.  Onychomycosis of left great toe Chronic fungal infection of the left great toe  for over two years. Over-the-counter treatments ineffective. Possible need for debridement. - Refer to podiatry for debridement and further management. - Photo in chart Adult Acne, aging skin changes - has trial facial washes and OTC creams - wants to trial Retin -A prescription - Retin-a  0.025% ordered  Allergic rhinitis Chronic allergic rhinitis with rhinorrhea. Advised Afrin use with rebound congestion risk., should not use more than 2-3 days.  Discussed Flonase  as safer alternative. - Recommend Flonase  for nasal congestion management.  Return in about 6 weeks (around 10/30/2023) for mood virtual.   I, Curtis DELENA Boom, FNP, have reviewed all documentation for this visit. The documentation on 09/19/23 for the exam, diagnosis, procedures, and orders are all accurate and complete.   Curtis DELENA Boom, FNP

## 2023-09-19 ENCOUNTER — Encounter: Payer: Self-pay | Admitting: Family Medicine

## 2023-10-31 ENCOUNTER — Ambulatory Visit (INDEPENDENT_AMBULATORY_CARE_PROVIDER_SITE_OTHER): Payer: Self-pay | Admitting: Family Medicine

## 2023-10-31 ENCOUNTER — Encounter: Payer: Self-pay | Admitting: Family Medicine

## 2023-10-31 VITALS — BP 104/78 | HR 55 | Temp 98.1°F | Ht 64.0 in | Wt 157.7 lb

## 2023-10-31 DIAGNOSIS — G894 Chronic pain syndrome: Secondary | ICD-10-CM

## 2023-10-31 DIAGNOSIS — F3281 Premenstrual dysphoric disorder: Secondary | ICD-10-CM

## 2023-10-31 DIAGNOSIS — R41840 Attention and concentration deficit: Secondary | ICD-10-CM

## 2023-10-31 DIAGNOSIS — F439 Reaction to severe stress, unspecified: Secondary | ICD-10-CM

## 2023-10-31 DIAGNOSIS — F39 Unspecified mood [affective] disorder: Secondary | ICD-10-CM

## 2023-10-31 DIAGNOSIS — F410 Panic disorder [episodic paroxysmal anxiety] without agoraphobia: Secondary | ICD-10-CM

## 2023-10-31 MED ORDER — DULOXETINE HCL 30 MG PO CPEP: 30.0000 mg | ORAL_CAPSULE | Freq: Every day | ORAL | 1 refills | Status: DC

## 2023-10-31 MED ORDER — CLONAZEPAM 0.5 MG PO TABS: 0.5000 mg | ORAL_TABLET | Freq: Two times a day (BID) | ORAL | 0 refills | Status: AC | PRN

## 2023-10-31 NOTE — Progress Notes (Signed)
 Established Patient Office Visit  Introduced to nurse practitioner role and practice setting.  All questions answered.  Discussed provider/patient relationship and expectations.   Subjective   Patient ID: Haley Moore, female    DOB: 1976/01/02  Age: 48 y.o. MRN: [id number]  Chief Complaint  Patient presents with   Follow-up    Patient is here for follow up on her mood.  States a lot of anxiety and irritability, Prozac  helps but seems to make her feel numb and just here also complaints of decreasing her sex drive.  Does not feel like Cymbalta  has done anything to help her mood at all.   Discussed the use of AI scribe software for clinical note transcription with the patient, who gave verbal consent to proceed.  History of Present Illness Haley Moore is a 48 year old female who presents for mood check.  She experiences worsening anxiety and mood symptoms, despite an improvement in pain with the cymbalta , possibly due to starting turmeric. Her mood has not improved and may have worsened. She has previously tried Prozac  but is no longer taking it. She also uses Klonopin  prn for panic/ stress  She feels overwhelmed easily and tends to shut down when faced with tasks, leading to procrastination. She is still able to perform daily activities and work but feels everything is 'amped up'. Her spouse is described as 'chill' and her coworker, who is also her best friend, is used to her behavior and has not expressed concern about it.  She has a history of hormonal issues, experiencing migraines lasting three days before her period and abnormal menstrual cycles. She has tried various birth control methods, which worsened her symptoms, causing severe mood swings. She is sensitive to estrogen, which has caused significant side effects in the past.  She has been researching potential ADHD, which she suspects may be contributing to her symptoms of being easily overwhelmed and  procrastination.      10/31/2023   11:00 AM 09/18/2023    2:15 PM 03/20/2023    1:05 PM  Depression screen PHQ 2/9  Decreased Interest 2 1 2   Down, Depressed, Hopeless 2 1 2   PHQ - 2 Score 4 2 4   Altered sleeping 3 3 2   Tired, decreased energy 3 1 2   Change in appetite 2 2 1   Feeling bad or failure about yourself  2 1 1   Trouble concentrating 2 3 1   Moving slowly or fidgety/restless 2 2 1   Suicidal thoughts 0 0 0  PHQ-9 Score 18 14 12   Difficult doing work/chores Somewhat difficult Somewhat difficult Somewhat difficult       10/31/2023   11:01 AM 09/18/2023    2:15 PM 03/20/2023    1:05 PM 04/28/2022   10:43 AM  GAD 7 : Generalized Anxiety Score  Nervous, Anxious, on Edge 3 1 2 1   Control/stop worrying 2 1 2 2   Worry too much - different things 2 1 2 2   Trouble relaxing 2 1 2 1   Restless 2 1 2  0  Easily annoyed or irritable 2 2 3 3   Afraid - awful might happen 2 2 2 2   Total GAD 7 Score 15 9 15 11   Anxiety Difficulty Somewhat difficult Somewhat difficult Somewhat difficult Extremely difficult     ROS  Negative unless indicated in HPI   Objective:     BP 104/78 (BP Location: Left Arm, Patient Position: Sitting, Cuff Size: Normal)   Pulse (!) 55   Temp 98.1  F (36.7 C) (Oral)   Ht 5' 4 (1.626 m)   Wt 157 lb 11.2 oz (71.5 kg)   SpO2 97%   BMI 27.07 kg/m    Physical Exam Constitutional:      General: She is not in acute distress.    Appearance: Normal appearance. She is normal weight. She is not ill-appearing, toxic-appearing or diaphoretic.  HENT:     Head: Normocephalic.     Nose: Nose normal.     Mouth/Throat:     Mouth: Mucous membranes are moist.     Pharynx: Oropharynx is clear.  Eyes:     Extraocular Movements: Extraocular movements intact.     Pupils: Pupils are equal, round, and reactive to light.  Cardiovascular:     Rate and Rhythm: Normal rate and regular rhythm.     Pulses: Normal pulses.     Heart sounds: Normal heart sounds. No murmur  heard.    No friction rub. No gallop.  Pulmonary:     Effort: No respiratory distress.     Breath sounds: No stridor. No wheezing, rhonchi or rales.  Chest:     Chest wall: No tenderness.  Musculoskeletal:     Right lower leg: No edema.     Left lower leg: No edema.  Skin:    General: Skin is warm and dry.     Capillary Refill: Capillary refill takes less than 2 seconds.  Neurological:     General: No focal deficit present.     Mental Status: She is alert and oriented to person, place, and time. Mental status is at baseline.     Cranial Nerves: No cranial nerve deficit.     Motor: No weakness.     Gait: Gait normal.  Psychiatric:        Mood and Affect: Mood normal.        Behavior: Behavior normal.        Thought Content: Thought content normal.        Judgment: Judgment normal.      No results found for any visits on 10/31/23.    The 10-year ASCVD risk score (Arnett DK, et al., 2019) is: 0.6%    Assessment & Plan:  Mood disorder -     DULoxetine  HCl; Take 1 capsule (30 mg total) by mouth daily.  Dispense: 90 capsule; Refill: 1  Chronic pain syndrome -     DULoxetine  HCl; Take 1 capsule (30 mg total) by mouth daily.  Dispense: 90 capsule; Refill: 1  PMDD (premenstrual dysphoric disorder) -     DULoxetine  HCl; Take 1 capsule (30 mg total) by mouth daily.  Dispense: 90 capsule; Refill: 1  Stress -     clonazePAM ; Take 1 tablet (0.5 mg total) by mouth 2 (two) times daily as needed (severe anxiety).  Dispense: 30 tablet; Refill: 0  Panic attacks -     clonazePAM ; Take 1 tablet (0.5 mg total) by mouth 2 (two) times daily as needed (severe anxiety).  Dispense: 30 tablet; Refill: 0  Inattention     Assessment and Plan Assessment & Plan Mood Disorder worsening symptoms of depression and anxiety. Current treatment with Cymbalta  20mg  daily has not improved mood. Psychiatry referral placed at previous visit for better management. Klonopin  is used as needed for  anxiety/panic, with half a tablet for mild symptoms and a full tablet for severe symptoms. She also have concerns for worsening inattention and focus - which maybe undiagnosed ADHD vs perimenopausal symptoms. She feel since  starting cymbalta  - her generalized pain has improved and would like to increase from pain perspective - Recommend further management through psychiatrist given labile mood and various trials of different mood medications including: fluoxetine , venlafaxine , trazodone , xanax, klonopin , and now duloxetine  - Increase Cymbalta  to 30 mg - Refill Klonopin  0.5mg  prn - PDMP reviewed - Refer to psychiatry at [facility] for further management - pt to call and schedule appointment   Chronic pain syndrome/ Generalized pain - improved with starting cymbalta , interested in increasing - Will increased Cymbalta  to 30mg  daily  Inattention/ Difficulty concentrating She reports feeling overwhelmed easily, worsening irritability due to limited focusing and attention - Feels she may have ADHD vs perimenopausal changes - Refer to psychiatry at [facility]  - pt to call and schedule  PMDD - migraines and worsening mood during different stages of cycle - interested in hormone levels through out - feel estrogen imbalance  - doesn't do well with HRT - recommend discussing with GYN for formal hormone level testing based on cycle   Return in about 6 months (around 04/29/2024).   I, Curtis DELENA Boom, FNP, have reviewed all documentation for this visit. The documentation on 10/31/23 for the exam, diagnosis, procedures, and orders are all accurate and complete.   Curtis DELENA Boom, FNP

## 2023-11-24 ENCOUNTER — Encounter: Payer: Self-pay | Admitting: Family Medicine

## 2023-11-24 ENCOUNTER — Other Ambulatory Visit: Payer: Self-pay | Admitting: Family Medicine

## 2023-11-24 DIAGNOSIS — F3281 Premenstrual dysphoric disorder: Secondary | ICD-10-CM

## 2023-11-24 DIAGNOSIS — F39 Unspecified mood [affective] disorder: Secondary | ICD-10-CM

## 2023-12-08 NOTE — Progress Notes (Signed)
    GYNECOLOGY PROGRESS NOTE  Subjective:    Patient ID: Haley Moore, female    DOB: Jan 10, 1976, 48 y.o.   MRN: [id number]  HPI  Patient is a 48 y.o. G0P0000 female who presents for evaluation for bacterial vaginosis and vaginal yeast infection. She has been having some vaginal discharge and itching on and off for a while now. She denies urinary symptoms and vaginal odor.  The following portions of the patient's history were reviewed and updated as appropriate: allergies, current medications, past family history, past medical history, past social history, past surgical history, and problem list.  Review of Systems Pertinent items are noted in HPI.   Objective:   Blood pressure 98/70, pulse 85, resp. rate 16, height 5' 4 (1.626 m), weight 153 lb 14.4 oz (69.8 kg). Body mass index is 26.42 kg/m. General appearance: alert, cooperative, and no distress   Assessment:   1. Acute vaginitis      Plan:   There are no diagnoses linked to this encounter.    Damien Parsley, CNM [facility]

## 2023-12-11 ENCOUNTER — Other Ambulatory Visit (HOSPITAL_COMMUNITY)
Admission: RE | Admit: 2023-12-11 | Discharge: 2023-12-11 | Disposition: A | Discharge status: Home / Self Care | Payer: Self-pay | Source: Ambulatory Visit | Attending: Certified Nurse Midwife | Admitting: Certified Nurse Midwife

## 2023-12-11 ENCOUNTER — Ambulatory Visit (INDEPENDENT_AMBULATORY_CARE_PROVIDER_SITE_OTHER): Payer: Self-pay | Admitting: Certified Nurse Midwife

## 2023-12-11 VITALS — BP 98/70 | HR 85 | Resp 16 | Ht 64.0 in | Wt 153.9 lb

## 2023-12-11 DIAGNOSIS — N760 Acute vaginitis: Secondary | ICD-10-CM | POA: Insufficient documentation

## 2023-12-11 NOTE — Patient Instructions (Signed)
 Vaginal Infection (Bacterial Vaginosis): What to Know  Bacterial vaginosis is an infection of the vagina. It happens when the balance of normal germs (bacteria) in the vagina changes. If you don't get treated, it can make it easier for you to get other infections from sex. These are called sexually transmitted infections (STIs). If you're pregnant, you need to get treated right away. This infection can cause a baby to be born early or at a low birth weight. What are the causes? This infection happens when too many harmful germs grow in the vagina. You can't get this infection from toilet seats, bedsheets, swimming pools, or things that touch your vagina. What increases the risk? Having sex with a new person or more than one person. Having sex without protection. Douching. Having an intrauterine device (IUD). Smoking. Using drugs or drinking alcohol. These can lead you to do risky things. Taking certain antibiotics. Being pregnant. What are the signs or symptoms? Some females have no symptoms. Symptoms may include: A gray or white discharge from your vagina. It can be watery or foamy. A fishy smell. This can happen after sex or during your menstrual period. Itching in and around your vagina. Burning or pain when you pee. How is this treated? This infection is treated with antibiotics. These may be given to you as: A pill. A cream for your vagina. A medicine that you put into your vagina (suppository). If the infection comes back, you may need more antibiotics. Follow these instructions at home: Medicines Take your medicines as told. Take or use your antibiotics as told. Do not stop using them even if you start to feel better. General instructions If the person you have sex with is a female, tell her that you have this infection. She will need to follow up with her doctor. Female partners don't need to be treated. Do not have sex until you finish treatment. Drink more fluids as  told. Keep your vagina and butt clean. Wash these areas with warm water each day. Wipe from front to back after you poop. If you're breastfeeding a baby, talk to your doctor if you should keep doing so during treatment. How is this prevented? Self-care Do not douche. Do not use deodorant sprays on your vagina. Wear cotton underwear. Do not wear tight pants and pantyhose, especially in the summer. Safe sex Use condoms the correct way and every time you have sex. Use dental dams to protect yourself during oral sex. Limit how many people you have sex with. Get tested for STIs. The person you have sex with should also get tested. Drugs and alcohol Do not smoke, vape, or use nicotine or tobacco. Do not use drugs. Limit the amount of alcohol you drink because it can lead you to do risky things. Where to find more information To learn more: Go to TonerPromos.no. Click Health Topics A-Z. Type bacterial vaginosis in the search bar. American Sexual Health Association (ASHA): ashasexualhealth.org U.S. Department of Health and [employer], Office on Women's Health: TravelLesson.ca Contact a doctor if: Your symptoms don't get better, even after treatment. You have more discharge or pain when you pee. You have a fever or chills. You have pain in your belly or in the area between your hips. You have pain during sex. You bleed from your vagina between menstrual periods. This information is not intended to replace advice given to you by your health care provider. Make sure you discuss any questions you have with your health care provider. Document  Revised: 07/13/2022 Document Reviewed: 07/13/2022 Elsevier Patient Education  2024 [employer].

## 2023-12-12 ENCOUNTER — Other Ambulatory Visit: Payer: Self-pay

## 2023-12-12 ENCOUNTER — Telehealth: Payer: Self-pay

## 2023-12-12 LAB — CERVICOVAGINAL ANCILLARY ONLY
Bacterial Vaginitis (gardnerella): POSITIVE — AB
Candida Glabrata: NEGATIVE
Candida Vaginitis: NEGATIVE
Chlamydia: NEGATIVE
Comment: NEGATIVE
Comment: NEGATIVE
Comment: NEGATIVE
Comment: NEGATIVE
Comment: NEGATIVE
Comment: NORMAL
Neisseria Gonorrhea: NEGATIVE
Trichomonas: NEGATIVE

## 2023-12-12 MED ORDER — METRONIDAZOLE 500 MG PO TABS
500.0000 mg | ORAL_TABLET | Freq: Two times a day (BID) | ORAL | 0 refills | Status: AC
Start: 2023-12-12 — End: ?

## 2023-12-12 NOTE — Telephone Encounter (Signed)
 Pt left voicemail asking for a call about results from yesterdays apt. Sent medication for bv to pharmacy on file pt confirmed and verbalized understanding.

## 2023-12-14 ENCOUNTER — Ambulatory Visit: Payer: Self-pay | Admitting: Registered Nurse

## 2024-01-18 ENCOUNTER — Ambulatory Visit: Payer: Self-pay

## 2024-01-18 VITALS — BP 97/71 | HR 82 | Resp 16 | Ht 64.0 in | Wt 148.0 lb

## 2024-01-18 DIAGNOSIS — Z01818 Encounter for other preprocedural examination: Secondary | ICD-10-CM

## 2024-01-18 DIAGNOSIS — Z1231 Encounter for screening mammogram for malignant neoplasm of breast: Secondary | ICD-10-CM

## 2024-01-18 DIAGNOSIS — R5383 Other fatigue: Secondary | ICD-10-CM

## 2024-01-18 NOTE — Progress Notes (Unsigned)
 Acute visit   Patient: Haley Moore   DOB: Aug 08, 1975   48 y.o. Female  MRN: [id number] PCP: Franchot Isaiah LABOR, MD   Chief Complaint  Patient presents with   Acute Visit    Not feeling well maybe due to med./ labs   Diabetes    s   Subjective    Discussed the use of AI scribe software for clinical note transcription with the patient, who gave verbal consent to proceed.  History of Present Illness Haley Moore is a 48 year old female who presents for pre-operative lab work and evaluation of recent episodes of shakiness and fatigue.  She is scheduled for a brachioplasty with plastic surgery and requires pre-operative lab work, specifically a CBC and CMP.   Over the past month, she has experienced episodes of shakiness and fatigue. She stopped taking tirzepatide  a week ago and suspects it might be related to her symptoms. She feels as though her blood sugar might be low, although she has never been diagnosed with hypoglycemia.  She eats regularly, including protein shakes. Her husband is hypoglycemic, and she has tried eating to alleviate symptoms but without improvement.  She has a history of endometriosis and was initially prescribed tirzepatide  by her gynecologist to manage pain associated with this condition.   In terms of family history, her husband is hypoglycemic. She has no personal history of diabetes, although it is present in her family. She has experienced low iron levels in the past, likely related to heavy menstrual periods, which have now become irregular.   Review of systems as noted in HPI.   Objective    BP 97/71 (BP Location: Right Arm, Patient Position: Sitting, Cuff Size: Normal)   Pulse 82   Resp 16   Ht 5' 4 (1.626 m)   Wt 148 lb (67.1 kg)   SpO2 98%   BMI 25.40 kg/m  Physical Exam Constitutional:      Appearance: Normal appearance.  HENT:     Head: Normocephalic and atraumatic.     Mouth/Throat:     Mouth: Mucous membranes are  moist.  Eyes:     Pupils: Pupils are equal, round, and reactive to light.  Pulmonary:     Effort: Pulmonary effort is normal.  Skin:    General: Skin is warm.  Neurological:     General: No focal deficit present.     Mental Status: She is alert.      Wt Readings from Last 3 Encounters:  01/18/24 148 lb (67.1 kg)  12/11/23 153 lb 14.4 oz (69.8 kg)  10/31/23 157 lb 11.2 oz (71.5 kg)    Results for orders placed or performed in visit on 01/18/24  CBC  Result Value Ref Range   WBC 6.5 3.4 - 10.8 x10E3/uL   RBC 4.33 3.77 - 5.28 x10E6/uL   Hemoglobin 14.0 11.1 - 15.9 g/dL   Hematocrit 59.0 65.9 - 46.6 %   MCV 95 79 - 97 fL   MCH 32.3 26.6 - 33.0 pg   MCHC 34.2 31.5 - 35.7 g/dL   RDW 87.6 88.2 - 84.5 %   Platelets 281 150 - 450 x10E3/uL  Comprehensive metabolic panel with GFR  Result Value Ref Range   Glucose 94 70 - 99 mg/dL   BUN 18 6 - 24 mg/dL   Creatinine, Ser 9.12 0.57 - 1.00 mg/dL   eGFR 82 >40 fO/fpw/8.26   BUN/Creatinine Ratio 21 9 - 23   Sodium 139 134 -  144 mmol/L   Potassium 4.1 3.5 - 5.2 mmol/L   Chloride 102 96 - 106 mmol/L   CO2 23 20 - 29 mmol/L   Calcium 9.8 8.7 - 10.2 mg/dL   Total Protein 6.7 6.0 - 8.5 g/dL   Albumin 4.5 3.9 - 4.9 g/dL   Globulin, Total 2.2 1.5 - 4.5 g/dL   Bilirubin Total 0.3 0.0 - 1.2 mg/dL   Alkaline Phosphatase 16 (L) 41 - 116 IU/L   AST 23 0 - 40 IU/L   ALT 28 0 - 32 IU/L  Iron, TIBC and Ferritin Panel  Result Value Ref Range   Total Iron Binding Capacity 247 (L) 250 - 450 ug/dL   UIBC 817 868 - 574 ug/dL   Iron 65 27 - 840 ug/dL   Iron Saturation 26 15 - 55 %   Ferritin 140 15 - 150 ng/mL  TSH  Result Value Ref Range   TSH 2.340 0.450 - 4.500 uIU/mL  Vitamin B12  Result Value Ref Range   Vitamin B-12 525 232 - 1,245 pg/mL    Assessment & Plan     Problem List Items Addressed This Visit       Other   Other fatigue - Primary   Relevant Orders   CBC (Completed)   Comprehensive metabolic panel with GFR (Completed)    Iron, TIBC and Ferritin Panel (Completed)   TSH (Completed)   Vitamin B12 (Completed)   Other Visit Diagnoses       Screening mammogram for breast cancer       Relevant Orders   MM 3D SCREENING MAMMOGRAM BILATERAL BREAST     Preoperative evaluation to rule out surgical contraindication          Assessment & Plan Preoperative evaluation for brachioplasty Preoperative evaluation for upcoming brachioplasty scheduled for January 13th or 14th. Labs required for surgical clearance include CBC and CMP. - Ordered CBC and CMP for surgical clearance. - Will fax lab results to plastic surgical team.  Fatigue and possible hypoglycemia Intermittent fatigue and possible hypoglycemia symptoms, possibly related to tirzepatide  or perimenopausal changes. - Agree with stopping Mounjaro  - Will check blood sugar levels. - Ordered thyroid function tests. - Ordered iron and B12 levels.  General health maintenance Due for mammogram and colon cancer screening. Last mammogram was in April 2024. Discussed colon cancer screening options. - Ordered mammogram referral to [facility]. - Discussed colon cancer screening options; she opted to wait on screening.   No orders of the defined types were placed in this encounter.    Return in about 6 months (around 07/18/2024) for Annual Physical Exam.      Isaiah DELENA Pepper, MD  [facility] [phone] (phone) [phone] (fax)

## 2024-01-19 DIAGNOSIS — R5383 Other fatigue: Secondary | ICD-10-CM | POA: Insufficient documentation

## 2024-01-19 LAB — IRON,TIBC AND FERRITIN PANEL
Ferritin: 140 ng/mL (ref 15–150)
Iron Saturation: 26 % (ref 15–55)
Iron: 65 ug/dL (ref 27–159)
Total Iron Binding Capacity: 247 ug/dL — ABNORMAL LOW (ref 250–450)
UIBC: 182 ug/dL (ref 131–425)

## 2024-01-19 LAB — TSH: TSH: 2.34 u[IU]/mL (ref 0.450–4.500)

## 2024-01-19 LAB — COMPREHENSIVE METABOLIC PANEL WITH GFR
ALT: 28 IU/L (ref 0–32)
AST: 23 IU/L (ref 0–40)
Albumin: 4.5 g/dL (ref 3.9–4.9)
Alkaline Phosphatase: 16 IU/L — ABNORMAL LOW (ref 41–116)
BUN/Creatinine Ratio: 21 (ref 9–23)
BUN: 18 mg/dL (ref 6–24)
Bilirubin Total: 0.3 mg/dL (ref 0.0–1.2)
CO2: 23 mmol/L (ref 20–29)
Calcium: 9.8 mg/dL (ref 8.7–10.2)
Chloride: 102 mmol/L (ref 96–106)
Creatinine, Ser: 0.87 mg/dL (ref 0.57–1.00)
Globulin, Total: 2.2 g/dL (ref 1.5–4.5)
Glucose: 94 mg/dL (ref 70–99)
Potassium: 4.1 mmol/L (ref 3.5–5.2)
Sodium: 139 mmol/L (ref 134–144)
Total Protein: 6.7 g/dL (ref 6.0–8.5)
eGFR: 82 mL/min/1.73 (ref >59)

## 2024-01-19 LAB — CBC
Hematocrit: 40.9 % (ref 34.0–46.6)
Hemoglobin: 14 g/dL (ref 11.1–15.9)
MCH: 32.3 pg (ref 26.6–33.0)
MCHC: 34.2 g/dL (ref 31.5–35.7)
MCV: 95 fL (ref 79–97)
Platelets: 281 x10E3/uL (ref 150–450)
RBC: 4.33 x10E6/uL (ref 3.77–5.28)
RDW: 12.3 % (ref 11.7–15.4)
WBC: 6.5 x10E3/uL (ref 3.4–10.8)

## 2024-01-19 LAB — VITAMIN B12: Vitamin B-12: 525 pg/mL (ref 232–1245)

## 2024-01-22 ENCOUNTER — Ambulatory Visit: Payer: Self-pay

## 2024-02-12 NOTE — Progress Notes (Signed)
 "  ANNUAL GYNECOLOGICAL EXAM  HPI  Haley Moore is a 49 y.o.-year-old G3P0030 who presents for an annual gynecological exam today.  She denies pelvic pain, abnormal vaginal bleeding or discharge, and UTI symptoms. She has had a few episodes of vaginitis over the past year. This has sometimes required rx, but other times, she's been able to treat with OTC monistat. She denies current vaginitis sx. She reports some vaginal dryness and pain with intercourse. She uses lubricant with sex. She is uninterested in HRT or vaginal estrogen. She has been on testosterone therapy in the past (injectable) that was prescribed by [facility] in [place]. She found that it helped with sex drive and also vaginal lubrication. She was last on this medication about a year ago. She is interested in restarting it.  Her PCP ordered her mammogram.  She has decided to do Cologard for her colon cancer screening and will have done through her PCP.  BTL for contraception.  Pap UTD (05/2022) WNL and No HR HPV detected.  Medical/Surgical History Past Medical History:  Diagnosis Date   ADHD    Allergy    Anxiety    Depression    Dysmenorrhea    Malignant melanoma of skin of chest (HCC)    2018   Menorrhagia    Migraine    Past Surgical History:  Procedure Laterality Date   BREAST ENHANCEMENT SURGERY     LAPAROSCOPIC TUBAL LIGATION N/A 10/25/2016   Procedure: LAPAROSCOPIC TUBAL LIGATION;  Surgeon: Lake Read, MD;  Location: [facility] ORS;  Service: Gynecology;  Laterality: N/A;   SKIN CANCER EXCISION     WRIST SURGERY      Social History Lives with Husband. Feels safe there Work: Dispensing optician Exercise: 5 days a week  Substances: Denies EtOH, tobacco, vape, and recreational drugs  Social Drivers of Health From This Encounter   Tobacco Use: Medium Risk (02/13/2024)   Patient History    Smoking Tobacco Use: Former    Smokeless Tobacco Use: Never    Passive Exposure: Not on Programmer, Applications Strain: Not on file  Food Insecurity: No Food Insecurity (02/13/2024)   Epic    Worried About Programme Researcher, Broadcasting/film/video in the Last Year: Never true    Ran Out of Food in the Last Year: Never true  Transportation Needs: No Transportation Needs (02/13/2024)   Epic    Lack of Transportation (Medical): No    Lack of Transportation (Non-Medical): No  Physical Activity: Sufficiently Active (02/13/2024)   Exercise Vital Sign    Days of Exercise per Week: 5 days    Minutes of Exercise per Session: 60 min  Stress: Stress Concern Present (02/13/2024)   [employer] of Occupational Health - Occupational Stress Questionnaire    Feeling of Stress: To some extent  Social Connections: Not on file  Intimate Partner Violence: Not At Risk (02/13/2024)   Epic    Fear of Current or Ex-Partner: No    Emotionally Abused: No    Physically Abused: No    Sexually Abused: No  Depression (PHQ2-9): High Risk (02/13/2024)   Depression (PHQ2-9)    PHQ-2 Score: 11  Alcohol Screen: Low Risk (02/13/2024)   Alcohol Screen    Last Alcohol Screening Score (AUDIT): 0  Housing: Low Risk (02/13/2024)   Epic    Unable to Pay for Housing in the Last Year: No    Number of Times Moved in the Last Year: 0  Homeless in the Last Year: No  Utilities: Not At Risk (02/13/2024)   Epic    Threatened with loss of utilities: No  Health Literacy: Adequate Health Literacy (02/13/2024)   B1300 Health Literacy    Frequency of need for help with medical instructions: Never    Obstetric History OB History     Gravida  3   Para  0   Term  0   Preterm  0   AB  3   Living         SAB  0   IAB  0   Ectopic  0   Multiple      Live Births               GYN/Menstrual History Patient's last menstrual period was 12/09/2023 (approximate). irregular periods from 1 to 2 days every other month. Last Pap: 05/24/22 (NILM) Contraception: BTL  Prevention Dentist- Will schedule next in July. Mammogram- Will  schedule Colonoscopy- NA due to age Flu shot/vaccines- Declines  Current Medications Show/hide medication list[1]    ROS Constitutional: Denied constitutional symptoms, night sweats, recent illness, fatigue, fever, insomnia and weight loss.  Eyes: Denied eye symptoms, eye pain, photophobia, vision change and visual disturbance.  Ears/Nose/Throat/Neck: Denied ear, nose, throat or neck symptoms, hearing loss, nasal discharge, sinus congestion and sore throat.  Cardiovascular: Denied cardiovascular symptoms, arrhythmia, chest pain/pressure, edema, exercise intolerance, orthopnea and palpitations.  Respiratory: Denied pulmonary symptoms, asthma, pleuritic pain, productive sputum, cough, dyspnea and wheezing.  Gastrointestinal: Denied gastro-esophageal reflux, melena, nausea and vomiting.  Genitourinary: Denied genitourinary symptoms including symptomatic vaginal discharge, pelvic relaxation issues, and urinary complaints.  Musculoskeletal: Denied musculoskeletal symptoms, stiffness, swelling, muscle weakness and myalgia.  Dermatologic: Denied dermatology symptoms, rash and scar.  Neurologic: Denied neurology symptoms, dizziness, headache, neck pain and syncope.  Psychiatric: Denied psychiatric symptoms, anxiety and depression.  Endocrine: Denied endocrine symptoms including hot flashes and night sweats.    OBJECTIVE  BP 123/81   Pulse 67   Ht 5' 4 (1.626 m)   Wt 160 lb 12.8 oz (72.9 kg)   LMP 12/09/2023 (Approximate)   BMI 27.60 kg/m    Physical examination General NAD, Conversant  Lungs: Normal WOB  Heart: Well perfused  Extremities: Moves all appropriately.  Normal ROM for age.  Neuro: Oriented to PPT.  Normal mood. Normal affect.     Pelvic: Declines    ASSESSMENT/ PLAN 1) Annual exam. Declines pelvic or breast exam.  2) Pap UTD. Due next 05/2027 with cotesting 3) Mammogram and colon cancer screening ordered by PCP 4) Perimenopausal sx of vaginal dryness and possibly  decreased libido- would prefer to have managed by [facility] in [place]. Would like to restart prior testosterone therapy. Declines traditional HRT.  5) Consider vaginal moisturizer like product from [employer] for daily use.  6) If develops vaginitis sx, discussed nurse visit for swab for BV/ Yeast. Does not need to swab for Gc/Ct as she is in a mutually monogamous relationship. Can also self tx with OTC monistat or boric acid capsules if there is a significant delay in being able to get in for a visit.  7) RTC prn.  Lauraine Lakes, CNM      [1]  Outpatient Medications Prior to Visit  Medication Sig   Cholecalciferol 25 MCG (1000 UT) tablet Take 1 tablet by mouth daily.   clonazePAM  (KLONOPIN ) 0.5 MG tablet Take 1 tablet (0.5 mg total) by mouth 2 (two) times daily as  needed (severe anxiety).   COLLAGEN PO Take 1 tablet by mouth daily.   FLUoxetine  (PROZAC ) 20 MG capsule Take 1 capsule (20 mg total) by mouth daily.   Multiple Vitamin (MULTIVITAMIN) tablet Take 1 tablet by mouth daily.   SUMAtriptan  (IMITREX ) 50 MG tablet TAKE 1 TABLET BY MOUTH ONCE DAILY AS NEEDED FOR  MIGRAINE  -  MAY  REPEAT  IN  2  HOURS  IF  HEADACHE  PERSISTS  OR  RECURS   tretinoin  (RETIN-A ) 0.025 % cream Apply topically at bedtime.   tirzepatide  (MOUNJARO ) 7.5 MG/0.5ML Pen Inject 7.5 mg into the skin once a week. TIRZEPATIDE  Compounded (Patient not taking: Reported on 02/13/2024)   No facility-administered medications prior to visit.   "

## 2024-02-13 ENCOUNTER — Encounter: Payer: Self-pay | Admitting: Registered Nurse

## 2024-02-13 ENCOUNTER — Ambulatory Visit (INDEPENDENT_AMBULATORY_CARE_PROVIDER_SITE_OTHER): Payer: Self-pay | Admitting: Registered Nurse

## 2024-02-13 VITALS — BP 123/81 | HR 67 | Ht 64.0 in | Wt 160.8 lb

## 2024-02-13 DIAGNOSIS — Z01419 Encounter for gynecological examination (general) (routine) without abnormal findings: Secondary | ICD-10-CM

## 2024-02-13 NOTE — Patient Instructions (Signed)
 Preventive Care 50-50 Years Old, Female  Preventive care refers to lifestyle choices and visits with your health care provider that can promote health and wellness. Preventive care visits are also called wellness exams.  What can I expect for my preventive care visit?  Counseling  Your health care provider may ask you questions about your:  Medical history, including:  Past medical problems.  Family medical history.  Pregnancy history.  Current health, including:  Menstrual cycle.  Method of birth control.  Emotional well-being.  Home life and relationship well-being.  Sexual activity and sexual health.  Lifestyle, including:  Alcohol, nicotine or tobacco, and drug use.  Access to firearms.  Diet, exercise, and sleep habits.  Work and work Astronomer.  Sunscreen use.  Safety issues such as seatbelt and bike helmet use.  Physical exam  Your health care provider will check your:  Height and weight. These may be used to calculate your BMI (body mass index). BMI is a measurement that tells if you are at a healthy weight.  Waist circumference. This measures the distance around your waistline. This measurement also tells if you are at a healthy weight and may help predict your risk of certain diseases, such as type 2 diabetes and high blood pressure.  Heart rate and blood pressure.  Body temperature.  Skin for abnormal spots.  What immunizations do I need?    Vaccines are usually given at various ages, according to a schedule. Your health care provider will recommend vaccines for you based on your age, medical history, and lifestyle or other factors, such as travel or where you work.  What tests do I need?  Screening  Your health care provider may recommend screening tests for certain conditions. This may include:  Lipid and cholesterol levels.  Diabetes screening. This is done by checking your blood sugar (glucose) after you have not eaten for a while (fasting).  Pelvic exam and Pap test.  Hepatitis B test.  Hepatitis C  test.  HIV (human immunodeficiency virus) test.  STI (sexually transmitted infection) testing, if you are at risk.  Lung cancer screening.  Colorectal cancer screening.  Mammogram. Talk with your health care provider about when you should start having regular mammograms. This may depend on whether you have a family history of breast cancer.  BRCA-related cancer screening. This may be done if you have a family history of breast, ovarian, tubal, or peritoneal cancers.  Bone density scan. This is done to screen for osteoporosis.  Talk with your health care provider about your test results, treatment options, and if necessary, the need for more tests.  Follow these instructions at home:  Eating and drinking    Eat a diet that includes fresh fruits and vegetables, whole grains, lean protein, and low-fat dairy products.  Take vitamin and mineral supplements as recommended by your health care provider.  Do not drink alcohol if:  Your health care provider tells you not to drink.  You are pregnant, may be pregnant, or are planning to become pregnant.  If you drink alcohol:  Limit how much you have to 0-1 drink a day.  Know how much alcohol is in your drink. In the [place] one drink equals one 12 oz bottle of beer (355 mL), one 5 oz glass of wine (148 mL), or one 1 oz glass of hard liquor (44 mL).  Lifestyle  Brush your teeth every morning and night with fluoride toothpaste. Floss one time each day.  Exercise for at least  30 minutes 5 or more days each week.  Do not use any products that contain nicotine or tobacco. These products include cigarettes, chewing tobacco, and vaping devices, such as e-cigarettes. If you need help quitting, ask your health care provider.  Do not use drugs.  If you are sexually active, practice safe sex. Use a condom or other form of protection to prevent STIs.  If you do not wish to become pregnant, use a form of birth control. If you plan to become pregnant, see your health care provider for a  prepregnancy visit.  Take aspirin only as told by your health care provider. Make sure that you understand how much to take and what form to take. Work with your health care provider to find out whether it is safe and beneficial for you to take aspirin daily.  Find healthy ways to manage stress, such as:  Meditation, yoga, or listening to music.  Journaling.  Talking to a trusted person.  Spending time with friends and family.  Minimize exposure to UV radiation to reduce your risk of skin cancer.  Safety  Always wear your seat belt while driving or riding in a vehicle.  Do not drive:  If you have been drinking alcohol. Do not ride with someone who has been drinking.  When you are tired or distracted.  While texting.  If you have been using any mind-altering substances or drugs.  Wear a helmet and other protective equipment during sports activities.  If you have firearms in your house, make sure you follow all gun safety procedures.  Seek help if you have been physically or sexually abused.  What's next?  Visit your health care provider once a year for an annual wellness visit.  Ask your health care provider how often you should have your eyes and teeth checked.  Stay up to date on all vaccines.  This information is not intended to replace advice given to you by your health care provider. Make sure you discuss any questions you have with your health care provider.  Document Revised: 07/22/2020 Document Reviewed: 07/22/2020  Elsevier Patient Education  2024 [employer].

## 2024-04-29 ENCOUNTER — Ambulatory Visit: Payer: Self-pay
